# Patient Record
Sex: Female | Born: 1990 | Race: White | Hispanic: No | Marital: Single | Smoking: Current every day smoker
Health system: Southern US, Community
[De-identification: ages and names within clinical notes are randomized; demographics above are authoritative.]

## PROBLEM LIST (undated history)

## (undated) DIAGNOSIS — F1111 Opioid abuse, in remission: Secondary | ICD-10-CM

## (undated) DIAGNOSIS — J45909 Unspecified asthma, uncomplicated: Secondary | ICD-10-CM

---

## 2016-11-13 ENCOUNTER — Emergency Department (HOSPITAL_COMMUNITY): Payer: No Typology Code available for payment source

## 2016-11-13 ENCOUNTER — Inpatient Hospital Stay (HOSPITAL_COMMUNITY): Payer: No Typology Code available for payment source

## 2016-11-13 ENCOUNTER — Encounter (HOSPITAL_COMMUNITY): Payer: Self-pay | Admitting: Emergency Medicine

## 2016-11-13 ENCOUNTER — Inpatient Hospital Stay (HOSPITAL_COMMUNITY): Payer: No Typology Code available for payment source | Admitting: Certified Registered"

## 2016-11-13 ENCOUNTER — Inpatient Hospital Stay (HOSPITAL_COMMUNITY)
Admission: EM | Admit: 2016-11-13 | Discharge: 2016-11-19 | DRG: 503 | Disposition: A | Discharge status: Home / Self Care | Payer: No Typology Code available for payment source | Attending: Surgery | Admitting: Surgery

## 2016-11-13 ENCOUNTER — Encounter (HOSPITAL_COMMUNITY): Admission: EM | Disposition: A | Discharge status: Home / Self Care | Payer: Self-pay | Source: Home / Self Care

## 2016-11-13 DIAGNOSIS — S36039A Unspecified laceration of spleen, initial encounter: Secondary | ICD-10-CM | POA: Diagnosis present

## 2016-11-13 DIAGNOSIS — J45909 Unspecified asthma, uncomplicated: Secondary | ICD-10-CM | POA: Diagnosis present

## 2016-11-13 DIAGNOSIS — F1111 Opioid abuse, in remission: Secondary | ICD-10-CM

## 2016-11-13 DIAGNOSIS — Y92410 Unspecified street and highway as the place of occurrence of the external cause: Secondary | ICD-10-CM | POA: Diagnosis not present

## 2016-11-13 DIAGNOSIS — A599 Trichomoniasis, unspecified: Secondary | ICD-10-CM | POA: Diagnosis present

## 2016-11-13 DIAGNOSIS — Z880 Allergy status to penicillin: Secondary | ICD-10-CM | POA: Diagnosis not present

## 2016-11-13 DIAGNOSIS — B373 Candidiasis of vulva and vagina: Secondary | ICD-10-CM | POA: Diagnosis present

## 2016-11-13 DIAGNOSIS — M25571 Pain in right ankle and joints of right foot: Secondary | ICD-10-CM | POA: Diagnosis present

## 2016-11-13 DIAGNOSIS — S92101B Unspecified fracture of right talus, initial encounter for open fracture: Secondary | ICD-10-CM | POA: Diagnosis present

## 2016-11-13 DIAGNOSIS — S36031A Moderate laceration of spleen, initial encounter: Secondary | ICD-10-CM | POA: Diagnosis present

## 2016-11-13 DIAGNOSIS — Q7291 Unspecified reduction defect of right lower limb: Secondary | ICD-10-CM

## 2016-11-13 DIAGNOSIS — S82891B Other fracture of right lower leg, initial encounter for open fracture type I or II: Secondary | ICD-10-CM

## 2016-11-13 DIAGNOSIS — M25461 Effusion, right knee: Secondary | ICD-10-CM | POA: Diagnosis present

## 2016-11-13 DIAGNOSIS — Z87898 Personal history of other specified conditions: Secondary | ICD-10-CM

## 2016-11-13 DIAGNOSIS — Z09 Encounter for follow-up examination after completed treatment for conditions other than malignant neoplasm: Secondary | ICD-10-CM

## 2016-11-13 DIAGNOSIS — F17210 Nicotine dependence, cigarettes, uncomplicated: Secondary | ICD-10-CM | POA: Diagnosis present

## 2016-11-13 DIAGNOSIS — F1191 Opioid use, unspecified, in remission: Secondary | ICD-10-CM

## 2016-11-13 HISTORY — PX: I & D EXTREMITY: SHX5045

## 2016-11-13 HISTORY — DX: Unspecified asthma, uncomplicated: J45.909

## 2016-11-13 LAB — CBC WITH DIFFERENTIAL/PLATELET
BASOS ABS: 0 10*3/uL (ref 0.0–0.1)
Basophils Relative: 0 %
EOS PCT: 4 %
Eosinophils Absolute: 0.4 10*3/uL (ref 0.0–0.7)
HEMATOCRIT: 37.5 % (ref 36.0–46.0)
Hemoglobin: 12.7 g/dL (ref 12.0–15.0)
LYMPHS PCT: 27 %
Lymphs Abs: 3.1 10*3/uL (ref 0.7–4.0)
MCH: 30.7 pg (ref 26.0–34.0)
MCHC: 33.9 g/dL (ref 30.0–36.0)
MCV: 90.6 fL (ref 78.0–100.0)
MONO ABS: 0.4 10*3/uL (ref 0.1–1.0)
MONOS PCT: 4 %
NEUTROS ABS: 7.3 10*3/uL (ref 1.7–7.7)
Neutrophils Relative %: 65 %
PLATELETS: 289 10*3/uL (ref 150–400)
RBC: 4.14 MIL/uL (ref 3.87–5.11)
RDW: 12.6 % (ref 11.5–15.5)
WBC: 11.2 10*3/uL — ABNORMAL HIGH (ref 4.0–10.5)

## 2016-11-13 LAB — CBC
HCT: 29.7 % — ABNORMAL LOW (ref 36.0–46.0)
HEMATOCRIT: 29.3 % — AB (ref 36.0–46.0)
HEMATOCRIT: 30.4 % — AB (ref 36.0–46.0)
Hemoglobin: 10 g/dL — ABNORMAL LOW (ref 12.0–15.0)
Hemoglobin: 10.1 g/dL — ABNORMAL LOW (ref 12.0–15.0)
Hemoglobin: 10.1 g/dL — ABNORMAL LOW (ref 12.0–15.0)
MCH: 30.6 pg (ref 26.0–34.0)
MCH: 30.8 pg (ref 26.0–34.0)
MCH: 30 pg (ref 26.0–34.0)
MCHC: 34.1 g/dL (ref 30.0–36.0)
MCHC: 34 g/dL (ref 30.0–36.0)
MCHC: 33.2 g/dL (ref 30.0–36.0)
MCV: 89.6 fL (ref 78.0–100.0)
MCV: 90.5 fL (ref 78.0–100.0)
MCV: 90.2 fL (ref 78.0–100.0)
PLATELETS: 194 10*3/uL (ref 150–400)
PLATELETS: 219 10*3/uL (ref 150–400)
Platelets: 206 10*3/uL (ref 150–400)
RBC: 3.27 MIL/uL — ABNORMAL LOW (ref 3.87–5.11)
RBC: 3.28 MIL/uL — AB (ref 3.87–5.11)
RBC: 3.37 MIL/uL — AB (ref 3.87–5.11)
RDW: 12.5 % (ref 11.5–15.5)
RDW: 12.8 % (ref 11.5–15.5)
RDW: 12.6 % (ref 11.5–15.5)
WBC: 8.6 10*3/uL (ref 4.0–10.5)
WBC: 6.8 10*3/uL (ref 4.0–10.5)
WBC: 6.5 10*3/uL (ref 4.0–10.5)

## 2016-11-13 LAB — I-STAT BETA HCG BLOOD, ED (MC, WL, AP ONLY): I-stat hCG, quantitative: <5 m[IU]/mL (ref <5)

## 2016-11-13 LAB — BASIC METABOLIC PANEL
Anion gap: 10 (ref 5–15)
BUN: 14 mg/dL (ref 6–20)
CALCIUM: 9.2 mg/dL (ref 8.9–10.3)
CO2: 26 mmol/L (ref 22–32)
Chloride: 99 mmol/L — ABNORMAL LOW (ref 101–111)
Creatinine, Ser: 0.94 mg/dL (ref 0.44–1.00)
GFR calc Af Amer: >60 mL/min (ref >60)
GLUCOSE: 126 mg/dL — AB (ref 65–99)
Potassium: 3.1 mmol/L — ABNORMAL LOW (ref 3.5–5.1)
Sodium: 135 mmol/L (ref 135–145)

## 2016-11-13 LAB — I-STAT CG4 LACTIC ACID, ED: Lactic Acid, Venous: 2.03 mmol/L (ref 0.5–1.9)

## 2016-11-13 LAB — HIV ANTIBODY (ROUTINE TESTING W REFLEX): HIV SCREEN 4TH GENERATION: NONREACTIVE

## 2016-11-13 LAB — ETHANOL: Alcohol, Ethyl (B): <5 mg/dL (ref <5)

## 2016-11-13 LAB — ABO/RH: ABO/RH(D): O POS

## 2016-11-13 LAB — TYPE AND SCREEN
ABO/RH(D): O POS
Antibody Screen: NEGATIVE

## 2016-11-13 LAB — PROTIME-INR
INR: 0.97
PROTHROMBIN TIME: 12.9 s (ref 11.4–15.2)

## 2016-11-13 LAB — MRSA PCR SCREENING: MRSA by PCR: NEGATIVE

## 2016-11-13 SURGERY — IRRIGATION AND DEBRIDEMENT EXTREMITY
Anesthesia: General | Site: Leg Lower | Laterality: Right

## 2016-11-13 MED ORDER — SUFENTANIL CITRATE 50 MCG/ML IV SOLN
INTRAVENOUS | Status: DC | PRN
  Administered 2016-11-13 (×3): 10 ug via INTRAVENOUS

## 2016-11-13 MED ORDER — CEFAZOLIN SODIUM 1 G IJ SOLR
INTRAMUSCULAR | Status: AC
  Filled 2016-11-13: qty 20

## 2016-11-13 MED ORDER — CHLORHEXIDINE GLUCONATE 4 % EX LIQD
60.0000 mL | Freq: Once | CUTANEOUS | Status: DC
  Filled 2016-11-13: qty 60

## 2016-11-13 MED ORDER — SUFENTANIL CITRATE 50 MCG/ML IV SOLN
INTRAVENOUS | Status: AC
  Filled 2016-11-13: qty 1

## 2016-11-13 MED ORDER — CEFAZOLIN SODIUM-DEXTROSE 2-4 GM/100ML-% IV SOLN
2.0000 g | INTRAVENOUS | Status: AC
  Filled 2016-11-13: qty 100

## 2016-11-13 MED ORDER — CEFAZOLIN IN D5W 1 GM/50ML IV SOLN
1.0000 g | Freq: Four times a day (QID) | INTRAVENOUS | Status: AC
  Administered 2016-11-13 (×2): 1 g via INTRAVENOUS
  Filled 2016-11-13 (×3): qty 50

## 2016-11-13 MED ORDER — DEXAMETHASONE SODIUM PHOSPHATE 10 MG/ML IJ SOLN
INTRAMUSCULAR | Status: DC | PRN
  Administered 2016-11-13: 10 mg via INTRAVENOUS

## 2016-11-13 MED ORDER — SODIUM CHLORIDE 0.9 % IV SOLN
INTRAVENOUS | Status: DC | PRN
  Administered 2016-11-13: 07:00:00 via INTRAVENOUS

## 2016-11-13 MED ORDER — ONDANSETRON HCL 4 MG/2ML IJ SOLN: 4.0000 mg | Freq: Four times a day (QID) | INTRAMUSCULAR | Status: DC | PRN

## 2016-11-13 MED ORDER — LIDOCAINE 2% (20 MG/ML) 5 ML SYRINGE
INTRAMUSCULAR | Status: DC | PRN
  Administered 2016-11-13: 40 mg via INTRAVENOUS
  Administered 2016-11-13: 100 mg via INTRAVENOUS

## 2016-11-13 MED ORDER — CEFAZOLIN SODIUM 1 G IJ SOLR
INTRAMUSCULAR | Status: DC | PRN
  Administered 2016-11-13: 2 g via INTRAMUSCULAR

## 2016-11-13 MED ORDER — SODIUM CHLORIDE 0.9 % IV SOLN
INTRAVENOUS | Status: DC
  Administered 2016-11-13: 06:00:00 via INTRAVENOUS

## 2016-11-13 MED ORDER — IOPAMIDOL (ISOVUE-300) INJECTION 61%
INTRAVENOUS | Status: AC
  Administered 2016-11-13: 100 mL
  Filled 2016-11-13: qty 100

## 2016-11-13 MED ORDER — DEXAMETHASONE SODIUM PHOSPHATE 10 MG/ML IJ SOLN
INTRAMUSCULAR | Status: AC
  Filled 2016-11-13: qty 1

## 2016-11-13 MED ORDER — OXYCODONE HCL 5 MG PO TABS: 5.0000 mg | ORAL_TABLET | Freq: Once | ORAL | Status: DC | PRN

## 2016-11-13 MED ORDER — SUCCINYLCHOLINE CHLORIDE 200 MG/10ML IV SOSY
PREFILLED_SYRINGE | INTRAVENOUS | Status: AC
  Filled 2016-11-13: qty 10

## 2016-11-13 MED ORDER — HYDROMORPHONE HCL 1 MG/ML IJ SOLN
0.5000 mg | INTRAMUSCULAR | Status: DC | PRN
  Administered 2016-11-13 – 2016-11-14 (×3): 0.5 mg via INTRAVENOUS
  Filled 2016-11-13 (×3): qty 1

## 2016-11-13 MED ORDER — SODIUM CHLORIDE 0.9 % IV BOLUS (SEPSIS)
1000.0000 mL | Freq: Once | INTRAVENOUS | Status: AC
  Administered 2016-11-13: 1000 mL via INTRAVENOUS

## 2016-11-13 MED ORDER — LACTATED RINGERS IV SOLN
INTRAVENOUS | Status: DC
  Administered 2016-11-14: 35 mL/h via INTRAVENOUS
  Administered 2016-11-14: 13:00:00 via INTRAVENOUS

## 2016-11-13 MED ORDER — DOCUSATE SODIUM 100 MG PO CAPS
100.0000 mg | ORAL_CAPSULE | Freq: Two times a day (BID) | ORAL | Status: DC
  Administered 2016-11-13 – 2016-11-19 (×10): 100 mg via ORAL
  Filled 2016-11-13 (×11): qty 1

## 2016-11-13 MED ORDER — PROPOFOL 10 MG/ML IV BOLUS
INTRAVENOUS | Status: AC
  Filled 2016-11-13: qty 20

## 2016-11-13 MED ORDER — FENTANYL CITRATE (PF) 100 MCG/2ML IJ SOLN
100.0000 ug | Freq: Once | INTRAMUSCULAR | Status: AC
  Administered 2016-11-13: 100 ug via INTRAVENOUS
  Filled 2016-11-13: qty 2

## 2016-11-13 MED ORDER — ONDANSETRON HCL 4 MG/2ML IJ SOLN
INTRAMUSCULAR | Status: AC
  Filled 2016-11-13: qty 2

## 2016-11-13 MED ORDER — LIDOCAINE 2% (20 MG/ML) 5 ML SYRINGE
INTRAMUSCULAR | Status: AC
  Filled 2016-11-13: qty 5

## 2016-11-13 MED ORDER — PROPOFOL 10 MG/ML IV BOLUS
INTRAVENOUS | Status: DC | PRN
  Administered 2016-11-13 (×2): 50 mg via INTRAVENOUS
  Administered 2016-11-13: 100 mg via INTRAVENOUS
  Administered 2016-11-13: 160 mg via INTRAVENOUS
  Administered 2016-11-13: 150 mg via INTRAVENOUS

## 2016-11-13 MED ORDER — SODIUM CHLORIDE 0.9 % IJ SOLN
INTRAMUSCULAR | Status: AC
  Filled 2016-11-13: qty 10

## 2016-11-13 MED ORDER — OXYCODONE HCL 5 MG PO TABS: 5.0000 mg | ORAL_TABLET | ORAL | Status: DC | PRN

## 2016-11-13 MED ORDER — ACETAMINOPHEN 325 MG PO TABS: 650.0000 mg | ORAL_TABLET | ORAL | Status: DC | PRN

## 2016-11-13 MED ORDER — OXYCODONE HCL 5 MG PO TABS
5.0000 mg | ORAL_TABLET | ORAL | Status: DC | PRN
  Administered 2016-11-13 – 2016-11-14 (×4): 10 mg via ORAL
  Filled 2016-11-13 (×5): qty 2

## 2016-11-13 MED ORDER — BISACODYL 10 MG RE SUPP
10.0000 mg | Freq: Every day | RECTAL | Status: DC | PRN
  Administered 2016-11-19: 10 mg via RECTAL
  Filled 2016-11-13: qty 1

## 2016-11-13 MED ORDER — HYDROMORPHONE HCL 1 MG/ML IJ SOLN: 0.5000 mg | INTRAMUSCULAR | Status: DC | PRN

## 2016-11-13 MED ORDER — SUCCINYLCHOLINE CHLORIDE 200 MG/10ML IV SOSY
PREFILLED_SYRINGE | INTRAVENOUS | Status: DC | PRN
  Administered 2016-11-13: 100 mg via INTRAVENOUS

## 2016-11-13 MED ORDER — LACTATED RINGERS IV SOLN
INTRAVENOUS | Status: DC | PRN
  Administered 2016-11-13: 08:00:00 via INTRAVENOUS

## 2016-11-13 MED ORDER — HYDROMORPHONE HCL 1 MG/ML IJ SOLN: 0.2500 mg | INTRAMUSCULAR | Status: DC | PRN

## 2016-11-13 MED ORDER — HYDROMORPHONE HCL 2 MG/ML IJ SOLN
1.0000 mg | Freq: Once | INTRAMUSCULAR | Status: AC
  Administered 2016-11-13: 1 mg via INTRAVENOUS
  Filled 2016-11-13: qty 1

## 2016-11-13 MED ORDER — ONDANSETRON HCL 4 MG/2ML IJ SOLN
4.0000 mg | Freq: Once | INTRAMUSCULAR | Status: AC
  Administered 2016-11-13: 4 mg via INTRAVENOUS

## 2016-11-13 MED ORDER — ONDANSETRON HCL 4 MG/2ML IJ SOLN
INTRAMUSCULAR | Status: DC | PRN
  Administered 2016-11-13: 4 mg via INTRAVENOUS

## 2016-11-13 MED ORDER — SODIUM CHLORIDE 0.9 % IR SOLN
Status: DC | PRN
  Administered 2016-11-13: 1000 mL

## 2016-11-13 MED ORDER — PROPOFOL 10 MG/ML IV BOLUS
INTRAVENOUS | Status: AC
  Filled 2016-11-13: qty 40

## 2016-11-13 MED ORDER — PROPOFOL 10 MG/ML IV BOLUS
40.0000 mg | Freq: Once | INTRAVENOUS | Status: AC
  Administered 2016-11-13: 40 mg via INTRAVENOUS

## 2016-11-13 MED ORDER — IOPAMIDOL (ISOVUE-370) INJECTION 76%
INTRAVENOUS | Status: AC
  Filled 2016-11-13: qty 50

## 2016-11-13 MED ORDER — PROPOFOL 10 MG/ML IV BOLUS
40.0000 mg | Freq: Once | INTRAVENOUS | Status: AC
  Administered 2016-11-13: 40 mg via INTRAVENOUS
  Filled 2016-11-13: qty 20

## 2016-11-13 MED ORDER — ORAL CARE MOUTH RINSE
15.0000 mL | Freq: Two times a day (BID) | OROMUCOSAL | Status: DC
  Administered 2016-11-13: 15 mL via OROMUCOSAL

## 2016-11-13 MED ORDER — CEFAZOLIN IN D5W 1 GM/50ML IV SOLN
1.0000 g | Freq: Once | INTRAVENOUS | Status: AC
  Administered 2016-11-13: 1 g via INTRAVENOUS
  Filled 2016-11-13: qty 50

## 2016-11-13 MED ORDER — HYDROMORPHONE HCL 2 MG/ML IJ SOLN
0.5000 mg | INTRAMUSCULAR | Status: DC | PRN
  Administered 2016-11-13: 0.5 mg via INTRAVENOUS
  Filled 2016-11-13: qty 1

## 2016-11-13 MED ORDER — ONDANSETRON HCL 4 MG PO TABS: 4.0000 mg | ORAL_TABLET | Freq: Four times a day (QID) | ORAL | Status: DC | PRN

## 2016-11-13 MED ORDER — OXYCODONE HCL 5 MG/5ML PO SOLN: 5.0000 mg | Freq: Once | ORAL | Status: DC | PRN

## 2016-11-13 MED ORDER — SODIUM CHLORIDE 0.9 % IR SOLN
Status: DC | PRN
  Administered 2016-11-13: 3000 mL

## 2016-11-13 SURGICAL SUPPLY — 55 items
BANDAGE ACE 4X5 VEL STRL LF (GAUZE/BANDAGES/DRESSINGS) ×3 IMPLANT
BANDAGE ACE 6X5 VEL STRL LF (GAUZE/BANDAGES/DRESSINGS) ×3 IMPLANT
BANDAGE ELASTIC 6 VELCRO ST LF (GAUZE/BANDAGES/DRESSINGS) ×3 IMPLANT
BANDAGE ESMARK 6X9 LF (GAUZE/BANDAGES/DRESSINGS) IMPLANT
BLADE SURG 10 STRL SS (BLADE) ×3 IMPLANT
BNDG COHESIVE 4X5 TAN STRL (GAUZE/BANDAGES/DRESSINGS) ×3 IMPLANT
BNDG ESMARK 4X9 LF (GAUZE/BANDAGES/DRESSINGS) IMPLANT
BNDG ESMARK 6X9 LF (GAUZE/BANDAGES/DRESSINGS)
BNDG GAUZE ELAST 4 BULKY (GAUZE/BANDAGES/DRESSINGS) ×3 IMPLANT
CONT SPEC 4OZ CLIKSEAL STRL BL (MISCELLANEOUS) IMPLANT
COVER SURGICAL LIGHT HANDLE (MISCELLANEOUS) ×3 IMPLANT
CUFF TOURN SGL LL 12 NO SLV (MISCELLANEOUS) IMPLANT
CUFF TOURNIQUET SINGLE 34IN LL (TOURNIQUET CUFF) ×3 IMPLANT
DRAPE SURG 17X23 STRL (DRAPES) IMPLANT
DRAPE U-SHAPE 47X51 STRL (DRAPES) IMPLANT
DRSG PAD ABDOMINAL 8X10 ST (GAUZE/BANDAGES/DRESSINGS) ×3 IMPLANT
DURAPREP 26ML APPLICATOR (WOUND CARE) ×3 IMPLANT
ELECT REM PT RETURN 9FT ADLT (ELECTROSURGICAL)
ELECTRODE REM PT RTRN 9FT ADLT (ELECTROSURGICAL) IMPLANT
EVACUATOR 1/8 PVC DRAIN (DRAIN) IMPLANT
FACESHIELD WRAPAROUND (MASK) IMPLANT
GAUZE SPONGE 4X4 12PLY STRL (GAUZE/BANDAGES/DRESSINGS) ×3 IMPLANT
GAUZE XEROFORM 1X8 LF (GAUZE/BANDAGES/DRESSINGS) ×3 IMPLANT
GLOVE BIO SURGEON STRL SZ7.5 (GLOVE) ×6 IMPLANT
GLOVE BIOGEL PI IND STRL 8 (GLOVE) ×2 IMPLANT
GLOVE BIOGEL PI INDICATOR 8 (GLOVE) ×4
GOWN STRL REUS W/ TWL LRG LVL3 (GOWN DISPOSABLE) ×3 IMPLANT
GOWN STRL REUS W/TWL LRG LVL3 (GOWN DISPOSABLE) ×6
HANDPIECE INTERPULSE COAX TIP (DISPOSABLE)
KIT BASIN OR (CUSTOM PROCEDURE TRAY) ×3 IMPLANT
KIT ROOM TURNOVER OR (KITS) ×3 IMPLANT
MANIFOLD NEPTUNE II (INSTRUMENTS) ×3 IMPLANT
NEEDLE 25GAX1.5 (MISCELLANEOUS) IMPLANT
NS IRRIG 1000ML POUR BTL (IV SOLUTION) ×3 IMPLANT
PACK ORTHO EXTREMITY (CUSTOM PROCEDURE TRAY) ×3 IMPLANT
PAD ABD 8X10 STRL (GAUZE/BANDAGES/DRESSINGS) ×6 IMPLANT
PAD ARMBOARD 7.5X6 YLW CONV (MISCELLANEOUS) ×6 IMPLANT
PADDING CAST ABS 4INX4YD NS (CAST SUPPLIES) ×4
PADDING CAST ABS 6INX4YD NS (CAST SUPPLIES) ×2
PADDING CAST ABS COTTON 4X4 ST (CAST SUPPLIES) ×2 IMPLANT
PADDING CAST ABS COTTON 6X4 NS (CAST SUPPLIES) ×1 IMPLANT
SET HNDPC FAN SPRY TIP SCT (DISPOSABLE) IMPLANT
SPONGE LAP 18X18 X RAY DECT (DISPOSABLE) IMPLANT
STOCKINETTE IMPERVIOUS 9X36 MD (GAUZE/BANDAGES/DRESSINGS) ×3 IMPLANT
SUT ETHILON 3 0 PS 1 (SUTURE) ×6 IMPLANT
SUT PDS AB 2-0 CT1 27 (SUTURE) IMPLANT
SYR CONTROL 10ML LL (SYRINGE) IMPLANT
TOWEL OR 17X24 6PK STRL BLUE (TOWEL DISPOSABLE) ×3 IMPLANT
TOWEL OR 17X26 10 PK STRL BLUE (TOWEL DISPOSABLE) ×3 IMPLANT
TUBE ANAEROBIC SPECIMEN COL (MISCELLANEOUS) IMPLANT
TUBE CONNECTING 12'X1/4 (SUCTIONS) ×1
TUBE CONNECTING 12X1/4 (SUCTIONS) ×2 IMPLANT
TUBING CYSTO DISP (UROLOGICAL SUPPLIES) ×3 IMPLANT
UNDERPAD 30X30 (UNDERPADS AND DIAPERS) ×3 IMPLANT
YANKAUER SUCT BULB TIP NO VENT (SUCTIONS) ×3 IMPLANT

## 2016-11-13 NOTE — Progress Notes (Signed)
Orthopedic Tech Progress Note Patient Details:  Caitlyn Ferguson 05/05/1991 [id number]  Ortho Devices Type of Ortho Device: Post (short leg) splint, Stirrup splint Ortho Device/Splint Location: rle. applied as per drs verbal order Ortho Device/Splint Interventions: Ordered, Application   Trinna PostMartinez, Talani Brazee J 11/13/2016, 2:42 AM

## 2016-11-13 NOTE — Op Note (Signed)
11/13/2016  8:31 AM  PATIENT:  Caitlyn Ferguson    PRE-OPERATIVE DIAGNOSIS:  open ankle fracture  POST-OPERATIVE DIAGNOSIS:  Same  PROCEDURE:  IRRIGATION AND DEBRIDEMENT FOOT  SURGEON:  Deriona Altemose, Jewel BaizeIMOTHY D, MD  ASSISTANT: Aquilla HackerHenry Martensen, PA-C, he was present and scrubbed throughout the case, critical for completion in a timely fashion, and for retraction, instrumentation, and closure.   ANESTHESIA:   gen  PREOPERATIVE INDICATIONS:  Caitlyn Ferguson is a  26 y.o. female with a diagnosis of open ankle fracture who failed conservative measures and elected for surgical management.    The risks benefits and alternatives were discussed with the patient preoperatively including but not limited to the risks of infection, bleeding, nerve injury, cardiopulmonary complications, the need for revision surgery, among others, and the patient was willing to proceed.  OPERATIVE IMPLANTS: none  OPERATIVE FINDINGS: unstable subtalar joint  BLOOD LOSS: min  COMPLICATIONS: none  TOURNIQUET TIME: none  OPERATIVE PROCEDURE:  Patient was identified in the preoperative holding area and site was marked by me She was transported to the operating theater and placed on the table in supine position taking care to pad all bony prominences. After a preincinduction time out anesthesia was induced. The right lower extremity was prepped and draped in normal sterile fashion and a pre-incision timeout was performed. She received ancef for preoperative antibiotics.   I performed a thorough irrigation of her open fracture with copious irrigation.  I performed a debridement of her open fracture dislocation site including skin muscle and bone, I used pickups for this. Her subtalar joint was unstable to inversion but was able to easily be held reduced with positioning.  She had smooth plantar and dorsi flextion with no bony block.  I performed a complex closure of her 8cm laceration    POST OPERATIVE PLAN: NWB, splint  then SLC. Mobilize and ASA for dvt px.

## 2016-11-13 NOTE — Progress Notes (Signed)
PT Cancellation Note  Patient Details Name: Caitlyn Ferguson MRN: [id number] DOB: October 16, 1990   Cancelled Treatment:    Reason Eval/Treat Not Completed: Patient not medically ready. Per surgeon's note on 11/13/16, bedrest for today. PT will f/u with pt when appropriate.   Alessandra BevelsJennifer M Kagan Hietpas 11/13/2016, 3:48 PM

## 2016-11-13 NOTE — Progress Notes (Signed)
To or already, will need defnitive care for ankle later Follow hb, bedrest today

## 2016-11-13 NOTE — ED Notes (Signed)
Patient taken to OR.

## 2016-11-13 NOTE — ED Notes (Signed)
Patient continues in CT, parents returned to Trauma B from waiting room.  Parents updated.

## 2016-11-13 NOTE — Progress Notes (Signed)
Dr Eulah PontMurphy at bedside to readjust splint- awaiting on Thayer Ohmhris, CRNA to bedside

## 2016-11-13 NOTE — ED Notes (Signed)
Patient continues in CT at this time.

## 2016-11-13 NOTE — Consult Note (Signed)
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 Talus Os Trigonum Calcaneus Cuboid  Plan: Closed reduction emergently in the ED performed by me.  OR today for Formal I&D and complex wound closure  PROCEDURE: sedation provided by ED staff. After appropriate timeout I performed a closed reduction of her subtalar joint.   Renette Butters, MD Cell [phone]   11/13/2016 7:48 AM

## 2016-11-13 NOTE — ED Notes (Signed)
Patient to CT.

## 2016-11-13 NOTE — ED Notes (Signed)
Per Dr Fredricka Bonineonnor, cspine clear.

## 2016-11-13 NOTE — Anesthesia Procedure Notes (Signed)
Procedure Name: Intubation Date/Time: 11/13/2016 7:35 AM Performed by: Claris Che Pre-anesthesia Checklist: Patient identified, Emergency Drugs available, Suction available, Patient being monitored and Timeout performed Patient Re-evaluated:Patient Re-evaluated prior to inductionOxygen Delivery Method: Circle system utilized Preoxygenation: Pre-oxygenation with 100% oxygen Intubation Type: IV induction, Rapid sequence and Cricoid Pressure applied Ventilation: Mask ventilation without difficulty Laryngoscope Size: Mac and 3 Grade View: Grade II Tube type: Oral Tube size: 8.0 mm Number of attempts: 1 Airway Equipment and Method: Stylet Placement Confirmation: ETT inserted through vocal cords under direct vision,  positive ETCO2 and breath sounds checked- equal and bilateral Secured at: 24 cm Tube secured with: Tape Dental Injury: Teeth and Oropharynx as per pre-operative assessment

## 2016-11-13 NOTE — Progress Notes (Signed)
[id number]- Splint redone by Dr Eulah PontMurphy at bedside in PACU- Dr Chaney MallingHodierne & Thayer Ohmhris, CRNA at bedside for procedure- Propofol given by Dr Chaney MallingHodierne. Pt tolerated procedure well. Ankle Xray reshot after splint replacedper orders.

## 2016-11-13 NOTE — ED Notes (Signed)
Trauma surgeon paged
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
## 2016-11-13 NOTE — Progress Notes (Signed)
Orthopedic Tech Progress Note Patient Details:  Mila Homershley Osuna 1991-07-10 [id number]  Ortho Devices Type of Ortho Device: Post (short leg) splint, Stirrup splint Ortho Device/Splint Location: rle Ortho Device/Splint Interventions: Ordered, Application Assisted dr with reduction and new splint reapplication.  Trinna PostMartinez, Clare Fennimore J 11/13/2016, 6:16 AM
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
## 2016-11-13 NOTE — ED Notes (Signed)
Dr. Murphy at bedside.

## 2016-11-13 NOTE — Transfer of Care (Signed)
Immediate Anesthesia Transfer of Care Note  Patient: Caitlyn Ferguson  Procedure(s) Performed: Procedure(s): IRRIGATION AND DEBRIDEMENT FOOT (Right)  Patient Location: PACU  Anesthesia Type:General  Level of Consciousness: sedated, patient cooperative and responds to stimulation  Airway & Oxygen Therapy: Patient Spontanous Breathing and Patient connected to nasal cannula oxygen  Post-op Assessment: Report given to RN, Post -op Vital signs reviewed and stable and Patient moving all extremities X 4  Post vital signs: Reviewed and stable  Last Vitals:  Vitals:   11/13/16 0630 11/13/16 0645  BP: 114/72 112/67  Pulse: 99 105  Resp: 15 19  Temp:      Last Pain:  Vitals:   11/13/16 0630  TempSrc:   PainSc: 8          Complications: No apparent anesthesia complications

## 2016-11-13 NOTE — Addendum Note (Signed)
Addendum  created 11/13/16 1104 by Rosalio Macadamiahristine T Kani Jobson, CRNA   Anesthesia Attestations filed, Anesthesia Intra Flowsheets edited, Anesthesia Intra Meds edited
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
## 2016-11-13 NOTE — Anesthesia Preprocedure Evaluation (Signed)
Anesthesia Evaluation  Patient identified by MRN, date of birth, ID band Patient awake    Reviewed: Allergy & Precautions, H&P , NPO status , Patient's Chart, lab work & pertinent test results  Airway Mallampati: II   Neck ROM: full    Dental   Pulmonary asthma , Current Smoker,    breath sounds clear to auscultation       Cardiovascular negative cardio ROS   Rhythm:regular Rate:Normal     Neuro/Psych    GI/Hepatic   Endo/Other    Renal/GU      Musculoskeletal   Abdominal   Peds  Hematology   Anesthesia Other Findings   Reproductive/Obstetrics                             Anesthesia Physical Anesthesia Plan  ASA: II  Anesthesia Plan: General   Post-op Pain Management:    Induction: Intravenous  Airway Management Planned: LMA  Additional Equipment:   Intra-op Plan:   Post-operative Plan:   Informed Consent: I have reviewed the patients History and Physical, chart, labs and discussed the procedure including the risks, benefits and alternatives for the proposed anesthesia with the patient or authorized representative who has indicated his/her understanding and acceptance.     Plan Discussed with: CRNA, Anesthesiologist and Surgeon  Anesthesia Plan Comments:         Anesthesia Quick Evaluation

## 2016-11-13 NOTE — ED Notes (Signed)
Patient complaining of left flank pain at this time.  Patient to be medicated per orders.

## 2016-11-13 NOTE — ED Notes (Signed)
Family at the bedside.
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
## 2016-11-13 NOTE — ED Notes (Signed)
Awaiting CT

## 2016-11-13 NOTE — ED Notes (Signed)
t 94.4 placed on the bair hugger  Rectal temp.  bp responding to iv nss  Pt resting from the dilaudid given

## 2016-11-13 NOTE — Anesthesia Postprocedure Evaluation (Signed)
Anesthesia Post Note  Patient: Caitlyn Ferguson  Procedure(s) Performed: Procedure(s) (LRB): IRRIGATION AND DEBRIDEMENT FOOT (Right)  Patient location during evaluation: PACU Anesthesia Type: General Level of consciousness: awake and alert and patient cooperative Pain management: pain level controlled Vital Signs Assessment: post-procedure vital signs reviewed and stable Respiratory status: spontaneous breathing and respiratory function stable Cardiovascular status: stable Anesthetic complications: no       Last Vitals:  Vitals:   11/13/16 0856 11/13/16 0912  BP: 106/70 120/76  Pulse: 78 78  Resp: (!) 29 17  Temp:      Last Pain:  Vitals:   11/13/16 0840  TempSrc:   PainSc: Asleep                 Joanthony Hamza S

## 2016-11-14 ENCOUNTER — Inpatient Hospital Stay (HOSPITAL_COMMUNITY): Payer: No Typology Code available for payment source | Admitting: Anesthesiology

## 2016-11-14 ENCOUNTER — Inpatient Hospital Stay (HOSPITAL_COMMUNITY): Payer: No Typology Code available for payment source

## 2016-11-14 ENCOUNTER — Encounter (HOSPITAL_COMMUNITY): Payer: Self-pay | Admitting: Orthopedic Surgery

## 2016-11-14 ENCOUNTER — Encounter (HOSPITAL_COMMUNITY): Admission: EM | Disposition: A | Discharge status: Home / Self Care | Payer: Self-pay | Source: Home / Self Care

## 2016-11-14 HISTORY — PX: ORIF CALCANEOUS FRACTURE: SHX5030

## 2016-11-14 LAB — COMPREHENSIVE METABOLIC PANEL
ALK PHOS: 52 U/L (ref 38–126)
ALT: 28 U/L (ref 14–54)
AST: 27 U/L (ref 15–41)
Albumin: 2.9 g/dL — ABNORMAL LOW (ref 3.5–5.0)
Anion gap: 6 (ref 5–15)
BILIRUBIN TOTAL: 0.3 mg/dL (ref 0.3–1.2)
BUN: 6 mg/dL (ref 6–20)
CALCIUM: 8.5 mg/dL — AB (ref 8.9–10.3)
CHLORIDE: 107 mmol/L (ref 101–111)
CO2: 23 mmol/L (ref 22–32)
CREATININE: 0.72 mg/dL (ref 0.44–1.00)
Glucose, Bld: 111 mg/dL — ABNORMAL HIGH (ref 65–99)
Potassium: 3.7 mmol/L (ref 3.5–5.1)
Sodium: 136 mmol/L (ref 135–145)
TOTAL PROTEIN: 5.4 g/dL — AB (ref 6.5–8.1)

## 2016-11-14 LAB — CBC
HEMATOCRIT: 29.8 % — AB (ref 36.0–46.0)
Hemoglobin: 9.8 g/dL — ABNORMAL LOW (ref 12.0–15.0)
MCH: 29.6 pg (ref 26.0–34.0)
MCHC: 32.9 g/dL (ref 30.0–36.0)
MCV: 90 fL (ref 78.0–100.0)
PLATELETS: 134 10*3/uL — AB (ref 150–400)
RBC: 3.31 MIL/uL — ABNORMAL LOW (ref 3.87–5.11)
RDW: 13 % (ref 11.5–15.5)
WBC: 7.2 10*3/uL (ref 4.0–10.5)

## 2016-11-14 SURGERY — OPEN REDUCTION INTERNAL FIXATION (ORIF) CALCANEOUS FRACTURE
Anesthesia: General | Site: Leg Lower | Laterality: Right

## 2016-11-14 MED ORDER — ROCURONIUM BROMIDE 50 MG/5ML IV SOSY
PREFILLED_SYRINGE | INTRAVENOUS | Status: AC
  Filled 2016-11-14: qty 5

## 2016-11-14 MED ORDER — FENTANYL CITRATE (PF) 100 MCG/2ML IJ SOLN
INTRAMUSCULAR | Status: AC
  Filled 2016-11-14: qty 2

## 2016-11-14 MED ORDER — ASPIRIN EC 325 MG PO TBEC: 325.0000 mg | DELAYED_RELEASE_TABLET | Freq: Every day | ORAL | 0 refills | Status: DC

## 2016-11-14 MED ORDER — ROCURONIUM BROMIDE 10 MG/ML (PF) SYRINGE
PREFILLED_SYRINGE | INTRAVENOUS | Status: DC | PRN
  Administered 2016-11-14: 10 mg via INTRAVENOUS
  Administered 2016-11-14: 50 mg via INTRAVENOUS

## 2016-11-14 MED ORDER — METHOCARBAMOL 500 MG PO TABS
500.0000 mg | ORAL_TABLET | Freq: Four times a day (QID) | ORAL | Status: DC | PRN
  Administered 2016-11-14 – 2016-11-18 (×9): 500 mg via ORAL
  Filled 2016-11-14 (×9): qty 1

## 2016-11-14 MED ORDER — METHOCARBAMOL 1000 MG/10ML IJ SOLN
500.0000 mg | Freq: Four times a day (QID) | INTRAVENOUS | Status: DC | PRN
  Filled 2016-11-14: qty 5

## 2016-11-14 MED ORDER — SUFENTANIL CITRATE 50 MCG/ML IV SOLN
INTRAVENOUS | Status: AC
  Filled 2016-11-14: qty 1

## 2016-11-14 MED ORDER — LIDOCAINE 2% (20 MG/ML) 5 ML SYRINGE
INTRAMUSCULAR | Status: DC | PRN
  Administered 2016-11-14: 40 mg via INTRAVENOUS
  Administered 2016-11-14: 60 mg via INTRAVENOUS

## 2016-11-14 MED ORDER — FENTANYL CITRATE (PF) 100 MCG/2ML IJ SOLN
INTRAMUSCULAR | Status: DC | PRN
  Administered 2016-11-14 (×3): 50 ug via INTRAVENOUS
  Administered 2016-11-14: 100 ug via INTRAVENOUS
  Administered 2016-11-14: 50 ug via INTRAVENOUS

## 2016-11-14 MED ORDER — MIDAZOLAM HCL 5 MG/5ML IJ SOLN
INTRAMUSCULAR | Status: DC | PRN
  Administered 2016-11-14: 2 mg via INTRAVENOUS

## 2016-11-14 MED ORDER — CEFAZOLIN SODIUM-DEXTROSE 2-4 GM/100ML-% IV SOLN
INTRAVENOUS | Status: AC
  Filled 2016-11-14: qty 100

## 2016-11-14 MED ORDER — PROPOFOL 10 MG/ML IV BOLUS
INTRAVENOUS | Status: AC
  Filled 2016-11-14: qty 20

## 2016-11-14 MED ORDER — ONDANSETRON HCL 4 MG PO TABS: 4.0000 mg | ORAL_TABLET | Freq: Three times a day (TID) | ORAL | 0 refills | Status: DC | PRN

## 2016-11-14 MED ORDER — DOCUSATE SODIUM 100 MG PO CAPS: 100.0000 mg | ORAL_CAPSULE | Freq: Two times a day (BID) | ORAL | 0 refills | Status: DC

## 2016-11-14 MED ORDER — ESMOLOL HCL 100 MG/10ML IV SOLN
INTRAVENOUS | Status: DC | PRN
  Administered 2016-11-14 (×2): 20 mg via INTRAVENOUS

## 2016-11-14 MED ORDER — HYDROMORPHONE HCL 1 MG/ML IJ SOLN
INTRAMUSCULAR | Status: AC
  Administered 2016-11-14: 0.5 mg via INTRAVENOUS
  Filled 2016-11-14: qty 0.5

## 2016-11-14 MED ORDER — HYDROMORPHONE HCL 1 MG/ML IJ SOLN
0.5000 mg | INTRAMUSCULAR | Status: DC | PRN
  Administered 2016-11-14 – 2016-11-15 (×3): 0.5 mg via INTRAVENOUS
  Filled 2016-11-14 (×3): qty 1

## 2016-11-14 MED ORDER — HYDROMORPHONE HCL 1 MG/ML IJ SOLN
INTRAMUSCULAR | Status: AC
  Filled 2016-11-14: qty 0.5

## 2016-11-14 MED ORDER — BUPIVACAINE HCL (PF) 0.25 % IJ SOLN
INTRAMUSCULAR | Status: AC
  Filled 2016-11-14: qty 60

## 2016-11-14 MED ORDER — METHOCARBAMOL 500 MG PO TABS: 500.0000 mg | ORAL_TABLET | Freq: Four times a day (QID) | ORAL | 0 refills | Status: DC | PRN

## 2016-11-14 MED ORDER — OXYCODONE HCL 5 MG PO TABS: 5.0000 mg | ORAL_TABLET | Freq: Once | ORAL | Status: DC | PRN

## 2016-11-14 MED ORDER — 0.9 % SODIUM CHLORIDE (POUR BTL) OPTIME
TOPICAL | Status: DC | PRN
  Administered 2016-11-14: 1000 mL

## 2016-11-14 MED ORDER — OXYCODONE-ACETAMINOPHEN 5-325 MG PO TABS: 1.0000 | ORAL_TABLET | ORAL | 0 refills | Status: DC | PRN

## 2016-11-14 MED ORDER — SUGAMMADEX SODIUM 200 MG/2ML IV SOLN
INTRAVENOUS | Status: DC | PRN
  Administered 2016-11-14: 141.8 mg via INTRAVENOUS

## 2016-11-14 MED ORDER — CEFAZOLIN SODIUM-DEXTROSE 2-3 GM-% IV SOLR
INTRAVENOUS | Status: DC | PRN
  Administered 2016-11-14: 2 g via INTRAVENOUS

## 2016-11-14 MED ORDER — PROPOFOL 10 MG/ML IV BOLUS
INTRAVENOUS | Status: DC | PRN
  Administered 2016-11-14: 150 mg via INTRAVENOUS
  Administered 2016-11-14: 30 mg via INTRAVENOUS

## 2016-11-14 MED ORDER — ONDANSETRON HCL 4 MG/2ML IJ SOLN
INTRAMUSCULAR | Status: DC | PRN
  Administered 2016-11-14: 4 mg via INTRAVENOUS

## 2016-11-14 MED ORDER — HYDROMORPHONE HCL 1 MG/ML IJ SOLN
0.2500 mg | INTRAMUSCULAR | Status: DC | PRN
  Administered 2016-11-14 (×3): 0.5 mg via INTRAVENOUS

## 2016-11-14 MED ORDER — ALBUTEROL SULFATE (2.5 MG/3ML) 0.083% IN NEBU
2.5000 mg | INHALATION_SOLUTION | Freq: Four times a day (QID) | RESPIRATORY_TRACT | Status: DC | PRN
  Administered 2016-11-14: 2.5 mg via RESPIRATORY_TRACT

## 2016-11-14 MED ORDER — MIDAZOLAM HCL 2 MG/2ML IJ SOLN
INTRAMUSCULAR | Status: AC
  Filled 2016-11-14: qty 2

## 2016-11-14 MED ORDER — OXYCODONE HCL 5 MG/5ML PO SOLN: 5.0000 mg | Freq: Once | ORAL | Status: DC | PRN

## 2016-11-14 MED ORDER — ALBUTEROL SULFATE (2.5 MG/3ML) 0.083% IN NEBU
INHALATION_SOLUTION | RESPIRATORY_TRACT | Status: AC
  Administered 2016-11-14: 2.5 mg via RESPIRATORY_TRACT
  Filled 2016-11-14: qty 3

## 2016-11-14 SURGICAL SUPPLY — 57 items
BANDAGE ACE 4X5 VEL STRL LF (GAUZE/BANDAGES/DRESSINGS) ×3 IMPLANT
BANDAGE ACE 6X5 VEL STRL LF (GAUZE/BANDAGES/DRESSINGS) ×3 IMPLANT
BANDAGE ESMARK 6X9 LF (GAUZE/BANDAGES/DRESSINGS) ×1 IMPLANT
BIT DRILL CANN 3.2 (BIT) ×1 IMPLANT
BNDG COHESIVE 4X5 TAN STRL (GAUZE/BANDAGES/DRESSINGS) ×3 IMPLANT
BNDG ESMARK 6X9 LF (GAUZE/BANDAGES/DRESSINGS) ×3
CLOSURE WOUND 1/2 X4 (GAUZE/BANDAGES/DRESSINGS) ×1
COVER SURGICAL LIGHT HANDLE (MISCELLANEOUS) ×3 IMPLANT
CUFF TOURNIQUET SINGLE 34IN LL (TOURNIQUET CUFF) ×3 IMPLANT
DRAPE C-ARM 42X72 X-RAY (DRAPES) IMPLANT
DRAPE ORTHO SPLIT 77X108 STRL (DRAPES)
DRAPE SURG ORHT 6 SPLT 77X108 (DRAPES) IMPLANT
DRAPE U-SHAPE 47X51 STRL (DRAPES) IMPLANT
DRILL BIT CANN 3.2 (BIT) ×3
DRSG ADAPTIC 3X8 NADH LF (GAUZE/BANDAGES/DRESSINGS) ×3 IMPLANT
DRSG PAD ABDOMINAL 8X10 ST (GAUZE/BANDAGES/DRESSINGS) ×3 IMPLANT
DURAPREP 26ML APPLICATOR (WOUND CARE) ×3 IMPLANT
ELECT REM PT RETURN 9FT ADLT (ELECTROSURGICAL) ×3
ELECTRODE REM PT RTRN 9FT ADLT (ELECTROSURGICAL) ×1 IMPLANT
GAUZE SPONGE 4X4 12PLY STRL (GAUZE/BANDAGES/DRESSINGS) ×3 IMPLANT
GAUZE XEROFORM 1X8 LF (GAUZE/BANDAGES/DRESSINGS) ×3 IMPLANT
GLOVE BIO SURGEON STRL SZ7.5 (GLOVE) ×6 IMPLANT
GLOVE BIOGEL PI IND STRL 8 (GLOVE) ×2 IMPLANT
GLOVE BIOGEL PI INDICATOR 8 (GLOVE) ×4
GOWN STRL REUS W/ TWL LRG LVL3 (GOWN DISPOSABLE) ×3 IMPLANT
GOWN STRL REUS W/TWL LRG LVL3 (GOWN DISPOSABLE) ×6
GUIDEWIRE NON THREAD 1.6MM (WIRE) ×6 IMPLANT
KIT BASIN OR (CUSTOM PROCEDURE TRAY) ×3 IMPLANT
KIT ROOM TURNOVER OR (KITS) ×3 IMPLANT
MANIFOLD NEPTUNE II (INSTRUMENTS) ×3 IMPLANT
NS IRRIG 1000ML POUR BTL (IV SOLUTION) ×3 IMPLANT
PACK ORTHO EXTREMITY (CUSTOM PROCEDURE TRAY) ×3 IMPLANT
PAD ABD 8X10 STRL (GAUZE/BANDAGES/DRESSINGS) ×3 IMPLANT
PAD ARMBOARD 7.5X6 YLW CONV (MISCELLANEOUS) ×6 IMPLANT
PAD CAST 4YDX4 CTTN HI CHSV (CAST SUPPLIES) ×2 IMPLANT
PADDING CAST ABS 4INX4YD NS (CAST SUPPLIES) ×2
PADDING CAST ABS 6INX4YD NS (CAST SUPPLIES) ×2
PADDING CAST ABS COTTON 4X4 ST (CAST SUPPLIES) ×1 IMPLANT
PADDING CAST ABS COTTON 6X4 NS (CAST SUPPLIES) ×1 IMPLANT
PADDING CAST COTTON 4X4 STRL (CAST SUPPLIES) ×4
SCREW HEADLESS COMP 4.5X20MM (Screw) ×3 IMPLANT
SCREW HEADLESS COMP 4.5X34MM (Screw) ×3 IMPLANT
SPONGE LAP 18X18 X RAY DECT (DISPOSABLE) ×3 IMPLANT
STRIP CLOSURE SKIN 1/2X4 (GAUZE/BANDAGES/DRESSINGS) ×2 IMPLANT
SUCTION FRAZIER HANDLE 10FR (MISCELLANEOUS) ×2
SUCTION TUBE FRAZIER 10FR DISP (MISCELLANEOUS) ×1 IMPLANT
SUT MNCRL AB 4-0 PS2 18 (SUTURE) ×3 IMPLANT
SUT MON AB 2-0 CT1 27 (SUTURE) ×6 IMPLANT
SYR BULB IRRIGATION 50ML (SYRINGE) ×3 IMPLANT
SYR CONTROL 10ML LL (SYRINGE) IMPLANT
TOWEL OR 17X24 6PK STRL BLUE (TOWEL DISPOSABLE) ×3 IMPLANT
TOWEL OR 17X26 10 PK STRL BLUE (TOWEL DISPOSABLE) ×3 IMPLANT
TUBE CONNECTING 12'X1/4 (SUCTIONS) ×1
TUBE CONNECTING 12X1/4 (SUCTIONS) ×2 IMPLANT
UNDERPAD 30X30 (UNDERPADS AND DIAPERS) ×3 IMPLANT
WATER STERILE IRR 1000ML POUR (IV SOLUTION) ×3 IMPLANT
YANKAUER SUCT BULB TIP NO VENT (SUCTIONS) IMPLANT

## 2016-11-14 NOTE — Progress Notes (Signed)
Notified Dr.Conner about elevated heart rate 120-140 likely related to pain. Pain med adjusted for better pain control I will continue to monitor.

## 2016-11-14 NOTE — Anesthesia Preprocedure Evaluation (Signed)
Anesthesia Evaluation  Patient identified by MRN, date of birth, ID band Patient awake    Reviewed: Allergy & Precautions, H&P , NPO status , Patient's Chart, lab work & pertinent test results  History of Anesthesia Complications Negative for: history of anesthetic complications  Airway Mallampati: II  TM Distance: >3 FB Neck ROM: full    Dental  (+) Teeth Intact   Pulmonary asthma , Current Smoker,    breath sounds clear to auscultation       Cardiovascular negative cardio ROS   Rhythm:regular Rate:Normal     Neuro/Psych negative neurological ROS  negative psych ROS   GI/Hepatic negative GI ROS, Neg liver ROS,   Endo/Other  negative endocrine ROS  Renal/GU negative Renal ROS     Musculoskeletal   Abdominal   Peds  Hematology  (+) anemia ,   Anesthesia Other Findings   Reproductive/Obstetrics                             Anesthesia Physical Anesthesia Plan  ASA: II  Anesthesia Plan: General and Regional   Post-op Pain Management:    Induction: Intravenous  Airway Management Planned: Oral ETT  Additional Equipment: None  Intra-op Plan:   Post-operative Plan: Extubation in OR  Informed Consent: I have reviewed the patients History and Physical, chart, labs and discussed the procedure including the risks, benefits and alternatives for the proposed anesthesia with the patient or authorized representative who has indicated his/her understanding and acceptance.   Dental advisory given  Plan Discussed with: CRNA and Surgeon  Anesthesia Plan Comments:         Anesthesia Quick Evaluation

## 2016-11-14 NOTE — Op Note (Signed)
11/13/2016 - 11/14/2016  2:48 PM  PATIENT:  Caitlyn Ferguson    PRE-OPERATIVE DIAGNOSIS:  open right ankle fx  POST-OPERATIVE DIAGNOSIS:  Same  PROCEDURE:  OPEN REDUCTION INTERNAL FIXATION (ORIF) TALUS  SURGEON:  Layli Capshaw, Jewel BaizeIMOTHY D, MD  ASSISTANT: Aquilla HackerHenry Martensen, PA-C, he was present and scrubbed throughout the case, critical for completion in a timely fashion, and for retraction, instrumentation, and closure.   ANESTHESIA:   gen  PREOPERATIVE INDICATIONS:  Caitlyn Homershley Wollman is a  26 y.o. female with a diagnosis of open right ankle fx who failed conservative measures and elected for surgical management.    The risks benefits and alternatives were discussed with the patient preoperatively including but not limited to the risks of infection, bleeding, nerve injury, cardiopulmonary complications, the need for revision surgery, among others, and the patient was willing to proceed.  OPERATIVE IMPLANTS: headleass synthes screws  OPERATIVE FINDINGS: unstable subtalar joint  BLOOD LOSS: min  COMPLICATIONS: none  TOURNIQUET TIME: 90  OPERATIVE PROCEDURE:  Patient was identified in the preoperative holding area and site was marked by me She was transported to the operating theater and placed on the table in supine position taking care to pad all bony prominences. After a preincinduction time out anesthesia was induced. The right lower extremity was prepped and draped in normal sterile fashion and a pre-incision timeout was performed. She received ancef for preoperative antibiotics.   Her subtalar joint was still grossly unstable.  I made a posterior medial approach to her ankle making a longitudinal incision just medial to the Achilles tendon I dissected down bluntly and identified her FHL tendon I retracted this medially thus protecting her neurovascular structures and her posterior tibial tendon. I then identified her fracture site I visualized her subtalar and tibiotalar joint. She had  significant comminution. I was able to reduce the 2 large pieces and pinned them into place. I then placed headless compression screws to hold these in place I visualized her joint it was smooth and mobilized well.  I then used fluoroscopy to place a smooth K wire across her talonavicular joint to hold this in place as well. I then took multiple x-rays and was happy with our reduction of the fracture and pinning as well as the open reduction and fixation of her talus. 

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
## 2016-11-14 NOTE — H&P (View-Only) (Signed)
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 of: Talus Os Trigonum Calcaneus Cuboid  Plan: Closed reduction emergently in the ED performed by me.  OR today for Formal I&D and complex wound closure  PROCEDURE: sedation provided by ED staff. After appropriate timeout I performed a closed reduction of her subtalar joint.   Renette Butters, MD Cell [phone]   11/13/2016 7:48 AM

## 2016-11-14 NOTE — Transfer of Care (Signed)
Immediate Anesthesia Transfer of Care Note  Patient: Caitlyn Ferguson  Procedure(s) Performed: Procedure(s): OPEN REDUCTION INTERNAL FIXATION (ORIF) TALUS (Right)  Patient Location: PACU  Anesthesia Type:General  Level of Consciousness: awake, alert , oriented, patient cooperative and responds to stimulation  Airway & Oxygen Therapy: Patient Spontanous Breathing and Patient connected to nasal cannula oxygen  Post-op Assessment: Report given to RN, Post -op Vital signs reviewed and stable and Patient moving all extremities X 4  Post vital signs: Reviewed and stable  Last Vitals:  Vitals:   11/13/16 2357 11/14/16 0355  BP: 113/69 114/66  Pulse: 88 96  Resp: 16 12  Temp: 36.8 C 37.3 C    Last Pain:  Vitals:   11/14/16 0728  TempSrc:   PainSc: Asleep      Patients Stated Pain Goal: 0 (11/13/16 1630)  Complications: No apparent anesthesia complications

## 2016-11-14 NOTE — Anesthesia Procedure Notes (Signed)
Procedure Name: Intubation Date/Time: 11/14/2016 10:45 AM Performed by: Tressia Miners LEFFEW Pre-anesthesia Checklist: Patient identified, Patient being monitored, Timeout performed, Emergency Drugs available and Suction available Patient Re-evaluated:Patient Re-evaluated prior to inductionOxygen Delivery Method: Circle System Utilized Preoxygenation: Pre-oxygenation with 100% oxygen Intubation Type: IV induction Ventilation: Mask ventilation without difficulty Laryngoscope Size: Mac and 3 Grade View: Grade II Tube type: Oral Tube size: 7.5 mm Number of attempts: 1 Airway Equipment and Method: Stylet Placement Confirmation: ETT inserted through vocal cords under direct vision,  positive ETCO2 and breath sounds checked- equal and bilateral Secured at: 22 cm Tube secured with: Tape Dental Injury: Teeth and Oropharynx as per pre-operative assessment
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
## 2016-11-14 NOTE — Anesthesia Postprocedure Evaluation (Addendum)
Anesthesia Post Note  Patient: Caitlyn Ferguson  Procedure(s) Performed: Procedure(s) (LRB): OPEN REDUCTION INTERNAL FIXATION (ORIF) TALUS (Right)  Patient location during evaluation: PACU Anesthesia Type: General Level of consciousness: awake and alert and patient cooperative Pain management: pain level controlled Vital Signs Assessment: post-procedure vital signs reviewed and stable Respiratory status: spontaneous breathing and respiratory function stable Cardiovascular status: stable Anesthetic complications: no       Last Vitals:  Vitals:   11/14/16 1453 11/14/16 1604  BP: (!) 135/95 (!) 142/94  Pulse: 84 (!) 117  Resp: 12 19  Temp:  37 C    Last Pain:  Vitals:   11/14/16 1604  TempSrc: Oral  PainSc:                  [name]

## 2016-11-14 NOTE — Progress Notes (Signed)
PT Cancellation Note  Patient Details Name: Mila Homershley Frye MRN: [id number] DOB: 1990/12/05   Cancelled Treatment:    Reason Eval/Treat Not Completed: Patient at procedure or test/unavailable;Patient not medically ready. Pt in OR undergoing R ankle ORIF. PT to follow up tomorrow for eval.   Ilda FoilGarrow, Gerhart Ruggieri Rene 11/14/2016, 9:08 AM

## 2016-11-14 NOTE — Interval H&P Note (Signed)
History and Physical Interval Note:  11/14/2016 7:39 AM  Caitlyn Ferguson  has presented today for surgery, with the diagnosis of open right ankle fx  The various methods of treatment have been discussed with the patient and family. After consideration of risks, benefits and other options for treatment, the patient has consented to  Procedure(s): OPEN REDUCTION INTERNAL FIXATION (ORIF) TALUS (Right) as a surgical intervention .  The patient's history has been reviewed, patient examined, no change in status, stable for surgery.  I have reviewed the patient's chart and labs.  Questions were answered to the patient's satisfaction.     Alexio Sroka D

## 2016-11-15 DIAGNOSIS — F1191 Opioid use, unspecified, in remission: Secondary | ICD-10-CM

## 2016-11-15 DIAGNOSIS — S82891B Other fracture of right lower leg, initial encounter for open fracture type I or II: Secondary | ICD-10-CM

## 2016-11-15 DIAGNOSIS — Z87898 Personal history of other specified conditions: Secondary | ICD-10-CM

## 2016-11-15 DIAGNOSIS — F1111 Opioid abuse, in remission: Secondary | ICD-10-CM

## 2016-11-15 LAB — CBC WITH DIFFERENTIAL/PLATELET
BASOS ABS: 0 10*3/uL (ref 0.0–0.1)
BASOS PCT: 0 %
EOS ABS: 0.1 10*3/uL (ref 0.0–0.7)
Eosinophils Relative: 1 %
HEMATOCRIT: 34.2 % — AB (ref 36.0–46.0)
Hemoglobin: 11.5 g/dL — ABNORMAL LOW (ref 12.0–15.0)
Lymphocytes Relative: 33 %
Lymphs Abs: 2.4 10*3/uL (ref 0.7–4.0)
MCH: 30.6 pg (ref 26.0–34.0)
MCHC: 33.6 g/dL (ref 30.0–36.0)
MCV: 91 fL (ref 78.0–100.0)
Monocytes Absolute: 0.6 10*3/uL (ref 0.1–1.0)
Monocytes Relative: 8 %
NEUTROS ABS: 4.2 10*3/uL (ref 1.7–7.7)
NEUTROS PCT: 58 %
Platelets: 183 10*3/uL (ref 150–400)
RBC: 3.76 MIL/uL — ABNORMAL LOW (ref 3.87–5.11)
RDW: 12.5 % (ref 11.5–15.5)
WBC: 7.2 10*3/uL (ref 4.0–10.5)

## 2016-11-15 LAB — CBC
HEMATOCRIT: 24.4 % — AB (ref 36.0–46.0)
Hemoglobin: 8.4 g/dL — ABNORMAL LOW (ref 12.0–15.0)
MCH: 31.2 pg (ref 26.0–34.0)
MCHC: 34.4 g/dL (ref 30.0–36.0)
MCV: 90.7 fL (ref 78.0–100.0)
Platelets: 135 10*3/uL — ABNORMAL LOW (ref 150–400)
RBC: 2.69 MIL/uL — ABNORMAL LOW (ref 3.87–5.11)
RDW: 12.9 % (ref 11.5–15.5)
WBC: 5.7 10*3/uL (ref 4.0–10.5)

## 2016-11-15 MED ORDER — OXYCODONE HCL 5 MG PO TABS
5.0000 mg | ORAL_TABLET | ORAL | Status: DC | PRN
  Administered 2016-11-15 (×2): 15 mg via ORAL
  Administered 2016-11-16: 10 mg via ORAL
  Filled 2016-11-15: qty 3
  Filled 2016-11-15: qty 2
  Filled 2016-11-15: qty 3

## 2016-11-15 MED ORDER — HYDROMORPHONE HCL 1 MG/ML IJ SOLN
1.0000 mg | INTRAMUSCULAR | Status: DC | PRN
  Administered 2016-11-15 – 2016-11-16 (×9): 1 mg via INTRAVENOUS
  Filled 2016-11-15 (×9): qty 1

## 2016-11-15 NOTE — Progress Notes (Signed)
Patient has been complaining of 10/10 pain in her right foot/ankle all morning. Pain medications have not been effective. Patient still able to wiggle toes, but states she feels like it's less than she was able to do yesterday. Dr. Eulah PontMurphy with ortho notified who stated to remove ACE wrap and he would be up to evaluate patient. Additional pain medication administered. Patient informed. RLE elevated on multiple pillows and ice packs applied.  Leanna BattlesEckelmann, Keyante Durio Eileen, RN.
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
## 2016-11-15 NOTE — Progress Notes (Addendum)
Addendum: Splint web roll split anteriorly the entire length to relieve pressure with increased swelling.  Patient reports continuous pain, but decreased slightly after splitting.  Maintained gauze dressing over percutaneous pinning site and replaced ace wrap more loosely.  HCM, PA-C 11/15/16 1310 [phone]  Assessment: 1 Day Post-Op  S/P Procedure(s) (LRB): OPEN REDUCTION INTERNAL FIXATION (ORIF) TALUS (Right), talonavicular pinning. by Dr. Jewel Baize. Murphy on 11/14/16  Principal Problem:   Splenic laceration Active Problems:   History of heroin abuse   History of methadone use (HCC)   Open right ankle fracture  Poor pain control with a history of heroin abuse.  5 years on methadone up to Spring 2017. Significant pain and swelling / effusion Right knee.  No lesion / erythema.  XR Neg. For Fx.    Plan: Increased pain medication some.  Dilaudid 1 mg q 2 hr, Oxycodone IR 5-15 q 3 hr.  Continue tylenol- May need additional adjustment dt hx of heroin / methadone use.  Elevate leg at all times to reduce pain / swelling. Mobilize when able / cleared for splenic laceration. May need immobilizer for Right knee - will likely need MRI at some point.  Exam very limited dt pain.  May consider therapeutic aspiration.   Weight Bearing: Non Weight Bearing (NWB) RLE. Dressings: Splint RLE.  VTE prophylaxis: Per primary when cleared for splenic laceration.  SCD, ambulation.   Would normally place on Lovenox while inpatient, then Lovenox or ASA 325 mg x 30 days post op.  Dispo: Per primary.  Will Be NWB 6+ weeks RLE.  Follow up outpatient with Dr. Wandra Feinstein at about 2 weeks post op.  We will plan to leave talonavicular pin in for about 4 weeks.  Subjective: Patient reports pain as severe. Pain poorly controlled with IV and PO meds.  Tolerating liquids.    Objective:   VITALS:   Vitals:   11/14/16 2014 11/14/16 2319 11/15/16 0353 11/15/16 0400  BP: (!) 132/95 130/83  (!) 137/95  Pulse:  (!) 130 (!) 125 (!) 110   Resp: 20 18 19    Temp: 98.5 F (36.9 C) 99 F (37.2 C) 99.8 F (37.7 C)   TempSrc: Oral Oral Oral   SpO2: 99% 99% 96%   Weight:      Height:       CBC Latest Ref Rng & Units 11/15/2016 11/14/2016 11/13/2016  WBC 4.0 - 10.5 K/uL 5.7 7.2 6.5  Hemoglobin 12.0 - 15.0 g/dL 0.9(W) 1.1(B) 10.1(L)  Hematocrit 36.0 - 46.0 % 24.4(L) 29.8(L) 30.4(L)  Platelets 150 - 400 K/uL 135(L) 134(L) 219   BMP Latest Ref Rng & Units 11/14/2016 11/13/2016  Glucose 65 - 99 mg/dL 147(W) 295(A)  BUN 6 - 20 mg/dL 6 14  Creatinine 2.13 - 1.00 mg/dL 0.86 5.78  Sodium 469 - 145 mmol/L 136 135  Potassium 3.5 - 5.1 mmol/L 3.7 3.1(L)  Chloride 101 - 111 mmol/L 107 99(L)  CO2 22 - 32 mmol/L 23 26  Calcium 8.9 - 10.3 mg/dL 6.2(X) 9.2   Intake/Output      03/04 0701 - 03/05 0700 03/05 0701 - 03/06 0700   I.V. (mL/kg) 1350 (19)    IV Piggyback     Total Intake(mL/kg) 1350 (19)    Urine (mL/kg/hr) 400 (0.2)    Blood 25 (0)    Total Output 425     Net +925            EXAM: PORTABLE RIGHT ANKLE - 2 VIEW  COMPARISON:  Intraoperative spot fluoroscopic images earlier today. Right ankle radiographs 11/13/2016.  FINDINGS: As seen on earlier intraoperative images, there has been interval talar fracture repair with placement of 2 screws in the posterior talus. The posterior talus fracture has been reduced and is now in anatomic alignment. The talonavicular dislocation has been reduced, and a percutaneous pin has been placed across the joint terminating in the anterior talar process. Small osseous fragments are again seen near the anterior process of the calcaneus, and there is a persistent mildly displaced fracture of the cuboid. Cast material is in place.  IMPRESSION: Postoperative changes from talar fracture repair and talonavicular reduction with additional previously described fractures as above.   Electronically Signed   By: Sebastian AcheAllen  Grady M.D.   On: 11/14/2016  15:42  Physical Exam: General: Very uncomfortable.  Lying in bed.  No increased wob.  Conversant.  MSK RLE: Splint in place.  Leg elevated.  Significant swelling / effusion Right knee. Pain with palpation / ROM at knee. Toes warm. EHL / FHL intact Sensation intact distally   Albina BilletHenry Calvin Martensen III, PA-C 11/15/2016, 7:32 AM
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
## 2016-11-15 NOTE — Progress Notes (Signed)
Trauma Service Note  Subjective: Patient in chair at the side of her bed.  No distress, but having a lot of pain on her left side and in her right leg  Objective: Vital signs in last 24 hours: Temp:  [97.5 F (36.4 C)-99.8 F (37.7 C)] 98.2 F (36.8 C) (03/05 0748) Pulse Rate:  [79-135] 121 (03/05 0748) Resp:  [12-24] 19 (03/05 0748) BP: (116-142)/(62-119) 136/92 (03/05 0748) SpO2:  [88 %-100 %] 96 % (03/05 0748) Last BM Date: 11/13/16  Intake/Output from previous day: 03/04 0701 - 03/05 0700 In: 1350 [I.V.:1350] Out: 425 [Urine:400; Blood:25] Intake/Output this shift: No intake/output data recorded.  General: Mild acute distress  Lungs: Clear bilaterally  Abd: Soft, tender in the LUQ.  Good bowel sounds.  Eating but not much  Extremities: Right foot very swollen, tender to passive dorsiflexion.  Compartment soft at the top of splint.  Neuro: Intact  Lab Results: CBC   Recent Labs  11/14/16 1545 11/15/16 0235  WBC 7.2 5.7  HGB 9.8* 8.4*  HCT 29.8* 24.4*  PLT 134* 135*   BMET  Recent Labs  11/13/16 0138 11/14/16 0240  NA 135 136  K 3.1* 3.7  CL 99* 107  CO2 26 23  GLUCOSE 126* 111*  BUN 14 6  CREATININE 0.94 0.72  CALCIUM 9.2 8.5*   PT/INR  Recent Labs  11/13/16 0138  LABPROT 12.9  INR 0.97   ABG No results for input(s): PHART, HCO3 in the last 72 hours.  Invalid input(s): PCO2, PO2  Studies/Results: Dg Ankle 2 Views Right  Result Date: 11/14/2016 CLINICAL DATA:  Repair of talar dome of fracture FLUOROSCOPY TIME:  106 seconds. Images: 2 EXAM: RIGHT ANKLE - 2 VIEW COMPARISON:  None. FINDINGS: Two screws project over the posterior talar dome by the end of the study consistent with fracture repair. A pin or surgical instrument project over the anterior inferior aspect of the talar dome. IMPRESSION: Talar dome of fracture repair as above. Electronically Signed   By: Gerome Samavid  Williams III M.D   On: 11/14/2016 15:18   X-ray Ankle Right Ap Lateral  And Oblique  Result Date: 11/13/2016 CLINICAL DATA:  Status post reduction. EXAM: RIGHT ANKLE - COMPLETE 3+ VIEW COMPARISON:  Radiograph of same day. FINDINGS: The right ankle has been casted and immobilized. There remains moderately displaced fracture involving the posterior portion of the talus. Continued dislocation of the talus the navicular joint is noted. IMPRESSION: Fracture dislocation as described above. Status post casting and immobilization. Electronically Signed   By: Lupita RaiderJames  Green Jr, M.D.   On: 11/13/2016 11:59   Dg Ankle Right Port  Result Date: 11/14/2016 CLINICAL DATA:  Status post talar fracture repair. EXAM: PORTABLE RIGHT ANKLE - 2 VIEW COMPARISON:  Intraoperative spot fluoroscopic images earlier today. Right ankle radiographs 11/13/2016. FINDINGS: As seen on earlier intraoperative images, there has been interval talar fracture repair with placement of 2 screws in the posterior talus. The posterior talus fracture has been reduced and is now in anatomic alignment. The talonavicular dislocation has been reduced, and a percutaneous pin has been placed across the joint terminating in the anterior talar process. Small osseous fragments are again seen near the anterior process of the calcaneus, and there is a persistent mildly displaced fracture of the cuboid. Cast material is in place. IMPRESSION: Postoperative changes from talar fracture repair and talonavicular reduction with additional previously described fractures as above. Electronically Signed   By: Sebastian AcheAllen  Grady M.D.   On: 11/14/2016 15:42  Dg C-arm 61-120 Min-no Report  Result Date: 11/14/2016 Fluoroscopy was utilized by the requesting physician.  No radiographic interpretation.    Anti-infectives: Anti-infectives    Start     Dose/Rate Route Frequency Ordered Stop   11/14/16 1000  ceFAZolin (ANCEF) 2-4 GM/100ML-% IVPB  Status:  Discontinued    Comments:  Marrianne Mood   : cabinet override      11/14/16 1000 11/14/16 1022    11/14/16 0600  ceFAZolin (ANCEF) IVPB 2g/100 mL premix     2 g 200 mL/hr over 30 Minutes Intravenous To ShortStay Surgical 11/13/16 1255 11/14/16 0624   11/13/16 0915  ceFAZolin (ANCEF) IVPB 1 g/50 mL premix     1 g 100 mL/hr over 30 Minutes Intravenous Every 6 hours 11/13/16 0907 11/14/16 0314   11/13/16 0300  ceFAZolin (ANCEF) IVPB 1 g/50 mL premix     1 g 100 mL/hr over 30 Minutes Intravenous  Once 11/13/16 0245 11/13/16 0345      Assessment/Plan: s/p Procedure(s): OPEN REDUCTION INTERNAL FIXATION (ORIF) TALUS Patient should be on bedrest because of Grade III splenic laceration at least until Wednesday morning.  Will place her back on bedrest, but keep diet advanced.  LOS: 2 days   Marta Lamas. Gae Bon, MD, FACS [phone] Trauma Surgeon 11/15/2016

## 2016-11-16 LAB — CBC
HCT: 27.8 % — ABNORMAL LOW (ref 36.0–46.0)
Hemoglobin: 9.3 g/dL — ABNORMAL LOW (ref 12.0–15.0)
MCH: 29.9 pg (ref 26.0–34.0)
MCHC: 33.5 g/dL (ref 30.0–36.0)
MCV: 89.4 fL (ref 78.0–100.0)
PLATELETS: 170 10*3/uL (ref 150–400)
RBC: 3.11 MIL/uL — ABNORMAL LOW (ref 3.87–5.11)
RDW: 12.4 % (ref 11.5–15.5)
WBC: 5.8 10*3/uL (ref 4.0–10.5)

## 2016-11-16 LAB — CBC WITH DIFFERENTIAL/PLATELET
Basophils Absolute: 0 10*3/uL (ref 0.0–0.1)
Basophils Relative: 0 %
EOS ABS: 0.1 10*3/uL (ref 0.0–0.7)
EOS PCT: 2 %
HCT: 26.7 % — ABNORMAL LOW (ref 36.0–46.0)
HEMOGLOBIN: 9.2 g/dL — AB (ref 12.0–15.0)
LYMPHS ABS: 2.4 10*3/uL (ref 0.7–4.0)
LYMPHS PCT: 42 %
MCH: 31 pg (ref 26.0–34.0)
MCHC: 34.5 g/dL (ref 30.0–36.0)
MCV: 89.9 fL (ref 78.0–100.0)
MONOS PCT: 9 %
Monocytes Absolute: 0.5 10*3/uL (ref 0.1–1.0)
Neutro Abs: 2.7 10*3/uL (ref 1.7–7.7)
Neutrophils Relative %: 47 %
PLATELETS: 148 10*3/uL — AB (ref 150–400)
RBC: 2.97 MIL/uL — ABNORMAL LOW (ref 3.87–5.11)
RDW: 12.7 % (ref 11.5–15.5)
WBC: 5.7 10*3/uL (ref 4.0–10.5)

## 2016-11-16 MED ORDER — POLYETHYLENE GLYCOL 3350 17 G PO PACK
17.0000 g | PACK | Freq: Every day | ORAL | Status: DC
  Administered 2016-11-16 – 2016-11-19 (×4): 17 g via ORAL
  Filled 2016-11-16 (×4): qty 1

## 2016-11-16 MED ORDER — TRAMADOL HCL 50 MG PO TABS
50.0000 mg | ORAL_TABLET | Freq: Four times a day (QID) | ORAL | Status: DC
  Administered 2016-11-16 – 2016-11-19 (×11): 50 mg via ORAL
  Filled 2016-11-16 (×13): qty 1

## 2016-11-16 MED ORDER — HYDROMORPHONE HCL 2 MG/ML IJ SOLN
1.0000 mg | INTRAMUSCULAR | Status: DC | PRN
  Administered 2016-11-16 – 2016-11-17 (×3): 1 mg via INTRAVENOUS
  Filled 2016-11-16 (×3): qty 1

## 2016-11-16 MED ORDER — OXYCODONE HCL 5 MG PO TABS
10.0000 mg | ORAL_TABLET | ORAL | Status: DC | PRN
  Administered 2016-11-16 – 2016-11-19 (×14): 20 mg via ORAL
  Filled 2016-11-16 (×16): qty 4
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
## 2016-11-16 NOTE — Progress Notes (Signed)
PT Cancellation Note  Patient Details Name: Caitlyn Ferguson MRN: [id number] DOB: Mar 07, 1991   Cancelled Treatment:    Reason Eval/Treat Not Completed: Patient not medically ready. Pt has been placed on bedrest, will follow and continue with PT services once appropriate.    Christiane HaBenjamin J. [name], PT, CSCS Pager [phone] Office [phone]  11/16/2016, 10:13 AM

## 2016-11-16 NOTE — Progress Notes (Signed)
Patient transferred to room 6N08 from 4E via bed.  Alert and oriented x4.  C/o pain 8/10 right leg. Oriented to room and call bell.

## 2016-11-16 NOTE — Progress Notes (Signed)
OT Cancellation Note  Patient Details Name: Mila Homershley Penagos MRN: [id number] DOB: 06-14-1991   Cancelled Treatment:    Reason Eval/Treat Not Completed: Patient not medically ready (strict bedrest order set) OT order received and appreciated however this conflicts with current bedrest order set. Please increase activity tolerance as appropriate and remove bedrest from orders. .  OT will hold evaluation at this time and will check back as time allows pending increased activity orders.   Boone MasterJones, Xavius Spadafore B 11/16/2016, 7:21 AM
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
## 2016-11-16 NOTE — Progress Notes (Signed)
Report given, Patient to be transported via bed to 6N room 8

## 2016-11-17 LAB — CBC
HCT: 28.1 % — ABNORMAL LOW (ref 36.0–46.0)
Hemoglobin: 9.5 g/dL — ABNORMAL LOW (ref 12.0–15.0)
MCH: 30.2 pg (ref 26.0–34.0)
MCHC: 33.8 g/dL (ref 30.0–36.0)
MCV: 89.2 fL (ref 78.0–100.0)
PLATELETS: 173 10*3/uL (ref 150–400)
RBC: 3.15 MIL/uL — AB (ref 3.87–5.11)
RDW: 12.3 % (ref 11.5–15.5)
WBC: 4.6 10*3/uL (ref 4.0–10.5)

## 2016-11-17 MED ORDER — FLUCONAZOLE 100 MG PO TABS
200.0000 mg | ORAL_TABLET | Freq: Once | ORAL | Status: AC
  Administered 2016-11-17: 200 mg via ORAL
  Filled 2016-11-17: qty 2

## 2016-11-17 NOTE — Progress Notes (Signed)
OT Cancellation Note  Patient Details Name: Mila Homershley Blasing MRN: [id number] DOB: 1990-12-15   Cancelled Treatment:    Reason Eval/Treat Not Completed: Patient not medically ready (bedrest order set remains) OT order received and appreciated however this conflicts with current bedrest order set. Please increase activity tolerance as appropriate and remove bedrest from orders.  OT will hold evaluation at this time and will check back as time allows pending increased activity orders.   Harolyn RutherfordJones, Gerhardt Gleed B  [phone] 11/17/2016, 7:16 AM

## 2016-11-17 NOTE — Progress Notes (Signed)
PT Cancellation Note  Patient Details Name: Caitlyn Ferguson MRN: [id number] DOB: [birth year]   Cancelled Treatment:    Reason Eval/Treat Not Completed: Medical issues which prohibited therapy (Pt still on bedrest.  Await incr activity orders. )Please update when pt is appropriate for mobility.     Amadeo GarnetDawn F Lux Skilton 11/17/2016, 8:23 AM Eber Jonesawn Cana Mignano,PT Acute Rehabilitation [phone] (pager)
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
## 2016-11-18 ENCOUNTER — Encounter (HOSPITAL_COMMUNITY): Payer: Self-pay | Admitting: Orthopedic Surgery

## 2016-11-18 LAB — URINALYSIS, ROUTINE W REFLEX MICROSCOPIC
Bilirubin Urine: NEGATIVE
Glucose, UA: NEGATIVE mg/dL
Ketones, ur: NEGATIVE mg/dL
Nitrite: NEGATIVE
PROTEIN: NEGATIVE mg/dL
Specific Gravity, Urine: 1.008 (ref 1.005–1.030)
pH: 6 (ref 5.0–8.0)

## 2016-11-18 LAB — CBC
HEMATOCRIT: 26.9 % — AB (ref 36.0–46.0)
HEMOGLOBIN: 9.2 g/dL — AB (ref 12.0–15.0)
MCH: 30.3 pg (ref 26.0–34.0)
MCHC: 34.2 g/dL (ref 30.0–36.0)
MCV: 88.5 fL (ref 78.0–100.0)
Platelets: 209 10*3/uL (ref 150–400)
RBC: 3.04 MIL/uL — ABNORMAL LOW (ref 3.87–5.11)
RDW: 12.3 % (ref 11.5–15.5)
WBC: 4.3 10*3/uL (ref 4.0–10.5)

## 2016-11-18 LAB — BASIC METABOLIC PANEL
ANION GAP: 6 (ref 5–15)
BUN: 6 mg/dL (ref 6–20)
CALCIUM: 8.6 mg/dL — AB (ref 8.9–10.3)
CHLORIDE: 99 mmol/L — AB (ref 101–111)
CO2: 28 mmol/L (ref 22–32)
Creatinine, Ser: 0.65 mg/dL (ref 0.44–1.00)
GFR calc non Af Amer: >60 mL/min (ref >60)
Glucose, Bld: 95 mg/dL (ref 65–99)
Potassium: 4.1 mmol/L (ref 3.5–5.1)
SODIUM: 133 mmol/L — AB (ref 135–145)

## 2016-11-18 MED ORDER — MAGNESIUM CITRATE PO SOLN
1.0000 | Freq: Once | ORAL | Status: AC
  Administered 2016-11-18: 1 via ORAL
  Filled 2016-11-18: qty 296
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
## 2016-11-19 DIAGNOSIS — A599 Trichomoniasis, unspecified: Secondary | ICD-10-CM

## 2016-11-19 LAB — CBC
HCT: 27.8 % — ABNORMAL LOW (ref 36.0–46.0)
HEMOGLOBIN: 9.3 g/dL — AB (ref 12.0–15.0)
MCH: 29.6 pg (ref 26.0–34.0)
MCHC: 33.5 g/dL (ref 30.0–36.0)
MCV: 88.5 fL (ref 78.0–100.0)
PLATELETS: 270 10*3/uL (ref 150–400)
RBC: 3.14 MIL/uL — AB (ref 3.87–5.11)
RDW: 12.3 % (ref 11.5–15.5)
WBC: 4.8 10*3/uL (ref 4.0–10.5)

## 2016-11-19 LAB — URINE CULTURE

## 2016-11-19 MED ORDER — CEFTRIAXONE SODIUM 500 MG IJ SOLR: 250.0000 mg | INTRAMUSCULAR | Status: DC

## 2016-11-19 MED ORDER — AZITHROMYCIN 250 MG PO TABS: 1000.0000 mg | ORAL_TABLET | Freq: Once | ORAL | Status: DC

## 2016-11-19 MED ORDER — OXYCODONE HCL 10 MG PO TABS: 10.0000 mg | ORAL_TABLET | ORAL | 0 refills | Status: DC | PRN

## 2016-11-19 MED ORDER — AZITHROMYCIN 1 G PO PACK: 1.0000 g | PACK | Freq: Once | ORAL | Status: DC

## 2016-11-19 MED ORDER — GENTAMICIN NICU IM SYRINGE 40 MG/ML: 240.0000 mg | Freq: Once | INTRAMUSCULAR | Status: DC

## 2016-11-19 MED ORDER — AZITHROMYCIN 250 MG PO TABS
2000.0000 mg | ORAL_TABLET | Freq: Once | ORAL | Status: AC
  Administered 2016-11-19: 2000 mg via ORAL
  Filled 2016-11-19: qty 8

## 2016-11-19 MED ORDER — DIPHENHYDRAMINE HCL 50 MG/ML IJ SOLN: 25.0000 mg | Freq: Four times a day (QID) | INTRAMUSCULAR | Status: DC | PRN

## 2016-11-19 MED ORDER — METRONIDAZOLE 500 MG PO TABS
2000.0000 mg | ORAL_TABLET | Freq: Once | ORAL | Status: AC
  Administered 2016-11-19: 2000 mg via ORAL
  Filled 2016-11-19: qty 4

## 2016-11-19 MED ORDER — GENTAMICIN SULFATE 40 MG/ML IJ SOLN
240.0000 mg | Freq: Once | INTRAMUSCULAR | Status: AC
  Administered 2016-11-19: 240 mg via INTRAMUSCULAR
  Filled 2016-11-19: qty 6
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
## 2016-11-19 NOTE — Progress Notes (Signed)
PT Cancellation Note  Patient Details Name: Caitlyn Ferguson DOB: [birth year]   Cancelled Treatment:    Reason Eval/Treat Not Completed: Patient declined, no reason specified Pt declined mobility despite encouragement to practice stairs. Pt getting ready to d/c home. PT will continue to follow acutely.    Caitlyn Ferguson, Caitlyn Ferguson   11/19/2016, 1:37 PM
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
## 2016-11-19 NOTE — Progress Notes (Signed)
Right lower leg splint dry and intact, maint on NWB. Pt is ambulating with walker. Pain is controlled with oral narcotics. Discharge instructions given to pt, verbalized understanding. Discharged to home.
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
## 2017-01-21 ENCOUNTER — Encounter (HOSPITAL_COMMUNITY): Payer: Self-pay | Admitting: Emergency Medicine

## 2017-01-21 ENCOUNTER — Emergency Department (HOSPITAL_COMMUNITY)
Admission: EM | Admit: 2017-01-21 | Discharge: 2017-01-21 | Disposition: A | Discharge status: Home / Self Care | Payer: Self-pay | Attending: Emergency Medicine | Admitting: Emergency Medicine

## 2017-01-21 ENCOUNTER — Emergency Department (HOSPITAL_COMMUNITY): Payer: Self-pay

## 2017-01-21 DIAGNOSIS — F17200 Nicotine dependence, unspecified, uncomplicated: Secondary | ICD-10-CM | POA: Insufficient documentation

## 2017-01-21 DIAGNOSIS — Z7982 Long term (current) use of aspirin: Secondary | ICD-10-CM | POA: Insufficient documentation

## 2017-01-21 DIAGNOSIS — M79671 Pain in right foot: Secondary | ICD-10-CM | POA: Insufficient documentation

## 2017-01-21 DIAGNOSIS — J45909 Unspecified asthma, uncomplicated: Secondary | ICD-10-CM | POA: Insufficient documentation

## 2017-01-21 DIAGNOSIS — G8918 Other acute postprocedural pain: Secondary | ICD-10-CM | POA: Insufficient documentation

## 2017-01-21 DIAGNOSIS — Z79899 Other long term (current) drug therapy: Secondary | ICD-10-CM | POA: Insufficient documentation

## 2017-01-21 LAB — BASIC METABOLIC PANEL
ANION GAP: 10 (ref 5–15)
BUN: 8 mg/dL (ref 6–20)
CO2: 23 mmol/L (ref 22–32)
Calcium: 8.9 mg/dL (ref 8.9–10.3)
Chloride: 102 mmol/L (ref 101–111)
Creatinine, Ser: 0.68 mg/dL (ref 0.44–1.00)
Glucose, Bld: 117 mg/dL — ABNORMAL HIGH (ref 65–99)
POTASSIUM: 3.9 mmol/L (ref 3.5–5.1)
SODIUM: 135 mmol/L (ref 135–145)

## 2017-01-21 LAB — CBC
HCT: 35.2 % — ABNORMAL LOW (ref 36.0–46.0)
Hemoglobin: 11.4 g/dL — ABNORMAL LOW (ref 12.0–15.0)
MCH: 26.7 pg (ref 26.0–34.0)
MCHC: 32.4 g/dL (ref 30.0–36.0)
MCV: 82.4 fL (ref 78.0–100.0)
PLATELETS: 330 10*3/uL (ref 150–400)
RBC: 4.27 MIL/uL (ref 3.87–5.11)
RDW: 13.4 % (ref 11.5–15.5)
WBC: 4.7 10*3/uL (ref 4.0–10.5)

## 2017-01-21 LAB — I-STAT CG4 LACTIC ACID, ED: LACTIC ACID, VENOUS: 1.32 mmol/L (ref 0.5–1.9)

## 2017-01-21 NOTE — ED Notes (Signed)
Pt. advised nurse first that she going out to her car .

## 2017-01-21 NOTE — ED Notes (Signed)
Ortho at bedside at this time.

## 2017-01-21 NOTE — ED Notes (Signed)
Notified Ortho of need for short leg splint.
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
## 2017-01-21 NOTE — ED Notes (Signed)
Paged Ortho x 2. Waiting for response. Patient needs short leg splint prior to discharge.

## 2017-01-21 NOTE — Progress Notes (Signed)
Orthopedic Tech Progress Note Patient Details:  Caitlyn Ferguson 1991/05/04 [id number]  Ortho Devices Type of Ortho Device: Short leg splint Ortho Device/Splint Location: applied short leg splint to pt right leg/ankle.  pt tolerated well.  right foot/ankle.  Ortho Device/Splint Interventions: Application   Alvina ChouWilliams, Randee Upchurch C 01/21/2017, 7:10 AM

## 2017-01-21 NOTE — ED Provider Notes (Signed)
[facility]-EMERGENCY DEPT Provider Note   CSN: [id number] Arrival date & time: 01/21/17  0230     History   Chief Complaint Chief Complaint  Patient presents with  . Post-op Problem    HPI Caitlyn Ferguson is a 26 y.o. female.  HPI Patient underwent repair of an open right talus fracture with Dr. Renaye Rakers on 11/14/2016 and presents back to the emergency department complaining of ongoing pain in her right foot.  She's been lost to follow-up and has had no evaluation by Dr. Eulah Pont since the operation.  She still has a K wire in her talonavicular joint.  She feels like she may have run a fever but is not sure.  She is concerned that she could start to have an infection in the right foot.  Her pain is mild to moderate in severity.  No other complaints   Past Medical History:  Diagnosis Date  . Asthma     Patient Active Problem List   Diagnosis Date Noted  . Trichimoniasis 11/19/2016  . History of heroin abuse 11/15/2016  . 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 Open right ankle fracture 11/15/2016  . Splenic laceration 11/13/2016    Past Surgical History:  Procedure Laterality Date  . I&D EXTREMITY Right 11/13/2016   Procedure: IRRIGATION AND DEBRIDEMENT FOOT;  Surgeon: Sheral Apley, MD;  Location: [facility] OR;  Service: Orthopedics;  Laterality: Right;  . ORIF CALCANEOUS FRACTURE Right 11/14/2016   Procedure: OPEN REDUCTION INTERNAL FIXATION (ORIF) TALUS;  Surgeon: Sheral Apley, MD;  Location: [facility] OR;  Service: Orthopedics;  Laterality: Right;    OB History    No data available       Home Medications    Prior to Admission medications   Medication Sig Start Date End Date Taking? Authorizing Provider  albuterol (PROVENTIL HFA;VENTOLIN HFA) 108 (90 Base) MCG/ACT inhaler Inhale 2 puffs into the lungs every 6 (six) hours as needed for wheezing or shortness of breath.    [provider]  aspirin EC 325 MG tablet Take 1 tablet (325 mg total) by mouth daily. For 30  days post op for DVT Prophylaxis.  Start after cleared to have dvt prophylaxis by Trauma team. 11/14/16   Albina Billet III, PA-C  docusate sodium (COLACE) 100 MG capsule Take 1 capsule (100 mg total) by mouth 2 (two) times daily. To prevent constipation while taking pain medication. 11/14/16   Martensen, Lucretia Kern III, PA-C  methocarbamol (ROBAXIN) 500 MG tablet Take 1 tablet (500 mg total) by mouth every 6 (six) hours as needed for muscle spasms. 11/14/16   Martensen, Lucretia Kern III, PA-C  ondansetron (ZOFRAN) 4 MG tablet Take 1 tablet (4 mg total) by mouth every 8 (eight) hours as needed for nausea or vomiting. 11/14/16   Albina Billet III, PA-C  oxyCODONE 10 MG TABS Take 1 tablet (10 mg total) by mouth every 4 (four) hours as needed for moderate pain or severe pain (10mg  for mild pain, 15mg  for moderate pain, 20mg  for severe pain). 11/19/16   Jerre Simon, PA    Family History No family history on file.  Social History Social History  Substance Use Topics  . Smoking status: Current Every Day Smoker    Packs/day: 0.50  . Smokeless tobacco: Never Used  . Alcohol use No     Allergies   Nickel; Pineapple; Augmentin [amoxicillin-pot clavulanate]; Other; and Penicillins   Review of Systems Review of Systems  All other systems reviewed  and are negative.    Physical Exam Updated Vital Signs BP 127/77   Pulse (!) 109   Temp 97.6 F (36.4 C) (Oral)   Resp 18   Ht 5\' 3"  (1.6 m)   Wt 110 lb (49.9 kg)   LMP 01/13/2017   SpO2 97%   BMI 19.49 kg/m   Physical Exam  Constitutional: She is oriented to person, place, and time. She appears well-developed and well-nourished.  HENT:  Head: Normocephalic.  Eyes: EOM are normal.  Neck: Normal range of motion.  Pulmonary/Chest: Effort normal.  Abdominal: She exhibits no distension.  Musculoskeletal:  K wire in the dorsum of the right foot with mild faint erythema without significant warmth.  No drainage.  Sutures  still in place in the right lateral foot and ankle near the Achilles.  No surrounding signs of infection.  Normal pulses right foot  Neurological: She is alert and oriented to person, place, and time.  Psychiatric: She has a normal mood and affect.  Nursing note and vitals reviewed.    ED Treatments / Results  Labs (all labs ordered are listed, but only abnormal results are displayed) Labs Reviewed  CBC - Abnormal; Notable for the following:       Result Value   Hemoglobin 11.4 (*)    HCT 35.2 (*)    All other components within normal limits  BASIC METABOLIC PANEL - Abnormal; Notable for the following:    Glucose, Bld 117 (*)    All other components within normal limits  I-STAT CG4 LACTIC ACID, ED  I-STAT CG4 LACTIC ACID, ED    EKG  EKG Interpretation None       Radiology Dg Ankle Complete Right  Result Date: 01/21/2017 CLINICAL DATA:  [age] y/o F; history of ankle surgery 2 months ago with 2 days of pain. EXAM: RIGHT ANKLE - COMPLETE 3+ VIEW COMPARISON:  11/13/2016 ankle CT. FINDINGS: No acute fracture or dislocation identified. Surgical screws fixate posterior talus fracture. Medial talar dome cortical irregularity compatible with osteochondral injury. Pin fixation of comminuted anterior calcaneus fracture. There are small bony fragments inferior to the bilateral malleoli from prior fracture. Symmetric ankle mortise. IMPRESSION: 1. No acute fracture or dislocation identified. 2. Screw fixation of healing posterior talus fracture and pin fixation of comminuted anterior calcaneus fracture. Electronically Signed   By: Mitzi HansenLance  Furusawa-Stratton M.D.   On: 01/21/2017 06:08    Procedures Procedures (including critical care time)  Medications Ordered in ED Medications - No data to display   Initial Impression / Assessment and Plan / ED Course  I have reviewed the triage vital signs and the nursing notes.  Pertinent labs & imaging results that were available during my care of the  patient were reviewed by me and considered in my medical decision making (see chart for details).     Patient will need follow-up with Dr. Eulah PontMurphy this morning.  I discussed the case with him and he is planning to see her in the office today @ 830am (2 hours from now).  Placed back in a posterior splint until follow up. nonweight bearing status  Final Clinical Impressions(s) / ED Diagnoses   Final diagnoses:  None    New Prescriptions New Prescriptions   No medications on file     Azalia Bilisampos, Harnoor Reta, MD 01/21/17 [phone]

## 2017-03-15 ENCOUNTER — Inpatient Hospital Stay (HOSPITAL_COMMUNITY)
Admit: 2017-03-15 | Discharge: 2017-03-15 | Disposition: A | Discharge status: Home / Self Care | Payer: Self-pay | Attending: Internal Medicine | Admitting: Internal Medicine

## 2017-03-15 ENCOUNTER — Emergency Department: Payer: Self-pay

## 2017-03-15 ENCOUNTER — Inpatient Hospital Stay: Payer: Self-pay

## 2017-03-15 ENCOUNTER — Encounter: Payer: Self-pay | Admitting: Emergency Medicine

## 2017-03-15 ENCOUNTER — Inpatient Hospital Stay
Admission: EM | Admit: 2017-03-15 | Discharge: 2017-03-17 | DRG: 541 | Disposition: A | Discharge status: Home / Self Care | Payer: Self-pay | Attending: Internal Medicine | Admitting: Internal Medicine

## 2017-03-15 DIAGNOSIS — F17210 Nicotine dependence, cigarettes, uncomplicated: Secondary | ICD-10-CM | POA: Diagnosis present

## 2017-03-15 DIAGNOSIS — R509 Fever, unspecified: Secondary | ICD-10-CM

## 2017-03-15 DIAGNOSIS — M869 Osteomyelitis, unspecified: Principal | ICD-10-CM | POA: Diagnosis present

## 2017-03-15 DIAGNOSIS — Z9119 Patient's noncompliance with other medical treatment and regimen: Secondary | ICD-10-CM

## 2017-03-15 DIAGNOSIS — Z8249 Family history of ischemic heart disease and other diseases of the circulatory system: Secondary | ICD-10-CM

## 2017-03-15 DIAGNOSIS — J45909 Unspecified asthma, uncomplicated: Secondary | ICD-10-CM | POA: Diagnosis present

## 2017-03-15 DIAGNOSIS — M1990 Unspecified osteoarthritis, unspecified site: Secondary | ICD-10-CM | POA: Diagnosis present

## 2017-03-15 DIAGNOSIS — Z880 Allergy status to penicillin: Secondary | ICD-10-CM

## 2017-03-15 HISTORY — DX: Opioid abuse, in remission: F11.11

## 2017-03-15 LAB — COMPREHENSIVE METABOLIC PANEL
ALBUMIN: 3.8 g/dL (ref 3.5–5.0)
ALK PHOS: 95 U/L (ref 38–126)
ALT: 10 U/L — AB (ref 14–54)
ANION GAP: 6 (ref 5–15)
AST: 20 U/L (ref 15–41)
BUN: 17 mg/dL (ref 6–20)
CALCIUM: 9.4 mg/dL (ref 8.9–10.3)
CO2: 27 mmol/L (ref 22–32)
Chloride: 103 mmol/L (ref 101–111)
Creatinine, Ser: 0.75 mg/dL (ref 0.44–1.00)
GFR calc Af Amer: >60 mL/min (ref >60)
GFR calc non Af Amer: >60 mL/min (ref >60)
GLUCOSE: 112 mg/dL — AB (ref 65–99)
Potassium: 3.3 mmol/L — ABNORMAL LOW (ref 3.5–5.1)
SODIUM: 136 mmol/L (ref 135–145)
Total Bilirubin: 0.6 mg/dL (ref 0.3–1.2)
Total Protein: 9.4 g/dL — ABNORMAL HIGH (ref 6.5–8.1)

## 2017-03-15 LAB — CBC
HCT: 35.4 % (ref 35.0–47.0)
HEMOGLOBIN: 11.8 g/dL — AB (ref 12.0–16.0)
MCH: 26.1 pg (ref 26.0–34.0)
MCHC: 33.4 g/dL (ref 32.0–36.0)
MCV: 78.1 fL — ABNORMAL LOW (ref 80.0–100.0)
Platelets: 502 10*3/uL — ABNORMAL HIGH (ref 150–440)
RBC: 4.53 MIL/uL (ref 3.80–5.20)
RDW: 16.6 % — ABNORMAL HIGH (ref 11.5–14.5)
WBC: 5.5 10*3/uL (ref 3.6–11.0)

## 2017-03-15 LAB — ECHOCARDIOGRAM COMPLETE: Weight: 1760 oz

## 2017-03-15 LAB — PREGNANCY, URINE: PREG TEST UR: NEGATIVE

## 2017-03-15 LAB — URINE DRUG SCREEN, QUALITATIVE (ARMC ONLY)
Amphetamines, Ur Screen: POSITIVE — AB
Barbiturates, Ur Screen: NOT DETECTED
Benzodiazepine, Ur Scrn: POSITIVE — AB
Cannabinoid 50 Ng, Ur ~~LOC~~: POSITIVE — AB
Cocaine Metabolite,Ur ~~LOC~~: NOT DETECTED
MDMA (Ecstasy)Ur Screen: NOT DETECTED
Methadone Scn, Ur: NOT DETECTED
Opiate, Ur Screen: POSITIVE — AB
Phencyclidine (PCP) Ur S: NOT DETECTED
Tricyclic, Ur Screen: NOT DETECTED

## 2017-03-15 LAB — SEDIMENTATION RATE: SED RATE: 67 mm/h — AB (ref 0–20)

## 2017-03-15 LAB — TSH: TSH: 0.976 u[IU]/mL (ref 0.350–4.500)

## 2017-03-15 MED ORDER — VANCOMYCIN HCL IN DEXTROSE 1-5 GM/200ML-% IV SOLN
1000.0000 mg | Freq: Once | INTRAVENOUS | Status: AC
  Administered 2017-03-15: 1000 mg via INTRAVENOUS
  Filled 2017-03-15: qty 200

## 2017-03-15 MED ORDER — POTASSIUM CHLORIDE CRYS ER 20 MEQ PO TBCR
40.0000 meq | EXTENDED_RELEASE_TABLET | ORAL | Status: AC
  Administered 2017-03-15: 40 meq via ORAL
  Filled 2017-03-15: qty 2

## 2017-03-15 MED ORDER — METHOCARBAMOL 500 MG PO TABS
500.0000 mg | ORAL_TABLET | Freq: Four times a day (QID) | ORAL | Status: DC | PRN
  Administered 2017-03-16 – 2017-03-17 (×4): 500 mg via ORAL
  Filled 2017-03-15 (×4): qty 1

## 2017-03-15 MED ORDER — ONDANSETRON HCL 4 MG/2ML IJ SOLN
4.0000 mg | Freq: Once | INTRAMUSCULAR | Status: AC
  Administered 2017-03-15: 4 mg via INTRAVENOUS
  Filled 2017-03-15: qty 2

## 2017-03-15 MED ORDER — MORPHINE SULFATE (PF) 2 MG/ML IV SOLN
2.0000 mg | Freq: Once | INTRAVENOUS | Status: AC
  Administered 2017-03-15: 2 mg via INTRAVENOUS
  Filled 2017-03-15: qty 1

## 2017-03-15 MED ORDER — ALBUTEROL SULFATE (2.5 MG/3ML) 0.083% IN NEBU: 2.5000 mg | INHALATION_SOLUTION | RESPIRATORY_TRACT | Status: DC | PRN

## 2017-03-15 MED ORDER — SODIUM CHLORIDE 0.9 % IV SOLN
1.0000 g | Freq: Three times a day (TID) | INTRAVENOUS | Status: DC
  Administered 2017-03-15 – 2017-03-17 (×6): 1 g via INTRAVENOUS
  Filled 2017-03-15 (×8): qty 1

## 2017-03-15 MED ORDER — VANCOMYCIN HCL IN DEXTROSE 750-5 MG/150ML-% IV SOLN
750.0000 mg | Freq: Two times a day (BID) | INTRAVENOUS | Status: DC
  Administered 2017-03-15 – 2017-03-16 (×3): 750 mg via INTRAVENOUS
  Filled 2017-03-15 (×5): qty 150

## 2017-03-15 MED ORDER — OXYCODONE-ACETAMINOPHEN 5-325 MG PO TABS
1.0000 | ORAL_TABLET | Freq: Four times a day (QID) | ORAL | Status: DC | PRN
  Administered 2017-03-15: 1 via ORAL
  Administered 2017-03-15 – 2017-03-17 (×6): 2 via ORAL
  Filled 2017-03-15 (×3): qty 2
  Filled 2017-03-15: qty 1
  Filled 2017-03-15 (×3): qty 2

## 2017-03-15 MED ORDER — DOCUSATE SODIUM 100 MG PO CAPS
100.0000 mg | ORAL_CAPSULE | Freq: Two times a day (BID) | ORAL | Status: DC
  Administered 2017-03-15 – 2017-03-17 (×2): 100 mg via ORAL
  Filled 2017-03-15 (×4): qty 1

## 2017-03-15 MED ORDER — GADOBENATE DIMEGLUMINE 529 MG/ML IV SOLN
10.0000 mL | Freq: Once | INTRAVENOUS | Status: AC | PRN
  Administered 2017-03-15: 10 mL via INTRAVENOUS

## 2017-03-15 MED ORDER — ONDANSETRON HCL 4 MG/2ML IJ SOLN: 4.0000 mg | Freq: Four times a day (QID) | INTRAMUSCULAR | Status: DC | PRN

## 2017-03-15 MED ORDER — ONDANSETRON HCL 4 MG PO TABS: 4.0000 mg | ORAL_TABLET | Freq: Four times a day (QID) | ORAL | Status: DC | PRN

## 2017-03-15 MED ORDER — ASPIRIN EC 325 MG PO TBEC: 325.0000 mg | DELAYED_RELEASE_TABLET | Freq: Every day | ORAL | Status: DC

## 2017-03-15 NOTE — Progress Notes (Signed)
*  PRELIMINARY RESULTS* Echocardiogram 2D Echocardiogram has been performed.  Caitlyn Ferguson 03/15/2017, 1:26 PM 

## 2017-03-15 NOTE — ED Notes (Signed)
Pt again denies ability to urinate.

## 2017-03-15 NOTE — ED Notes (Signed)
MD Manson PasseyBrown and this Rn at bedside at this time

## 2017-03-15 NOTE — Progress Notes (Signed)
Subjective: Patient admitted early this morning with osteomyelitis. Currently has no complaints.  Objective: Vital signs in last 24 hours: Temp:  [98 F (36.7 C)] 98 F (36.7 C) (07/03 0808) Pulse Rate:  [76-110] 84 (07/03 0808) Resp:  [16-18] 18 (07/03 0808) BP: (103-119)/(66-84) 115/66 (07/03 0808) SpO2:  [99 %-100 %] 100 % (07/03 0808) Weight:  [49.9 kg (110 lb)] 49.9 kg (110 lb) (07/03 0359) Weight change:     Intake/Output from previous day: No intake/output data recorded. Intake/Output this shift: No intake/output data recorded.  General appearance: no distress  Lab Results:  Recent Labs  03/15/17 0444  WBC 5.5  HGB 11.8*  HCT 35.4  PLT 502*   BMET  Recent Labs  03/15/17 0444  NA 136  K 3.3*  CL 103  CO2 27  GLUCOSE 112*  BUN 17  CREATININE 0.75  CALCIUM 9.4    Studies/Results: Dg Foot Complete Right  Result Date: 03/15/2017 CLINICAL DATA:  26 y/o F; cellulitis with pertinent drainage from pole ORIF vertical site. Concern for osteomyelitis. EXAM: RIGHT FOOT COMPLETE - 3+ VIEW COMPARISON:  None. FINDINGS: There are lucencies within the head of the talus at its medial margin which may represent erosive changes of osteomyelitis. Linear lucency traversing the head talus and navicular bone from prior pin fixation. Soft tissue swelling about the midfoot. No acute fracture identified. IMPRESSION: Lucencies within the head of the talus at its medial margin may represent osteomyelitis. Consider MRI or three-phase bone scan for confirmation. Electronically Signed   By: Mitzi HansenLance  Furusawa-Stratton M.D.   On: 03/15/2017 05:11    Medications: I have reviewed the patient's current medications.  Assessment/Plan: 1. Osteomyelitis: Right foot; currently on IV vancomycin and meropenem. Podiatry is been consulted for possible debridement. 2. History of narcotic abuse: Urine drug screen was positive for multiple substances. Has history of IV drug use but denies any recently.  Echocardiogram ordered to assess for vegetation.  3. Asthma: Controlled; continue albuterol as needed  LOS: 0 days   Gracelyn NurseJohnston,  Toneisha Savary D 03/15/2017, 2:42 PM

## 2017-03-15 NOTE — Progress Notes (Signed)
Patient resting in bed. Patient removed all of her jewelry and placed in her personal bag. Pain meds given earlier with some relief. Visitor at bedside earlier mentioned that patient will need something like dilaudid or morphine, and that percocet wont touch anything. No c/o distress. Continue to monitor.

## 2017-03-15 NOTE — ED Notes (Signed)
Admitting MD at bedside.
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
## 2017-03-15 NOTE — Progress Notes (Signed)
Pharmacy Antibiotic Note  Caitlyn Ferguson is a 26 y.o. female admitted on 03/15/2017 with  Osteomyelitis.  Pharmacy has been consulted for Vancomycin and Meropenem dosing. Patient received Vancomycin 1gm IV x 1 dose in ED. Patient also has allergy (Rash, Swelling) to PCN.   Plan: Ke: 0.075   T1/2: 9.24   Vd: 35   Will start patient on Vancomycin 750mg  IV every 12 hours with 8 hour stack dosing. Calculated trough at Css is 16.9. Trough ordered prior to 4th dose. Will monitor renal function and adjust dose as needed.   Will start patient on meropenem 1g IV every 8 hours.   Weight: 110 lb (49.9 kg)  Temp (24hrs), Avg:98 F (36.7 C), Min:98 F (36.7 C), Max:98 F (36.7 C)   Recent Labs Lab 03/15/17 0444  WBC 5.5  CREATININE 0.75    Estimated Creatinine Clearance: 84.7 mL/min (by C-G formula based on SCr of 0.75 mg/dL).    Allergies  Allergen Reactions  . Nickel Rash  . Pineapple Nausea And Vomiting  . Augmentin [Amoxicillin-Pot Clavulanate] Swelling and Rash    Throat swelling  . Other Swelling and Rash    Reaction to cut grass  . Penicillins Swelling and Rash    Throat swelling Has patient had a PCN reaction causing immediate rash, facial/tongue/throat swelling, SOB or lightheadedness with hypotension: Yes Has patient had a PCN reaction causing severe rash involving mucus membranes or skin necrosis: No Has patient had a PCN reaction that required hospitalization No Has patient had a PCN reaction occurring within the last 10 years: No If all of the above answers are "NO", then may proceed with Cephalosporin use.    Antimicrobials this admission: 7/3 vancomycin >>  7/3 meropenem  >>   Dose adjustments this admission:   Microbiology results: 7/3 BCx: pending  Thank you for allowing pharmacy to be a part of this patient's care.  Gardner CandleSheema M Azim Gillingham, PharmD, BCPS Clinical Pharmacist 03/15/2017 8:21 AM
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
## 2017-03-15 NOTE — ED Notes (Addendum)
11 stitches removed from pt rt foot by MD Manson PasseyBrown. This RN and MD Manson PasseyBrown in with update

## 2017-03-15 NOTE — ED Notes (Signed)
Urine specimen again requested from pt at this time. Pt lying in bed comfortably with VSS in NAD with SO at bedside. Denies current concerns

## 2017-03-16 LAB — HEMOGLOBIN A1C
Hgb A1c MFr Bld: 5.4 % (ref 4.8–5.6)
Mean Plasma Glucose: 108 mg/dL

## 2017-03-16 MED ORDER — NICOTINE 21 MG/24HR TD PT24
21.0000 mg | MEDICATED_PATCH | Freq: Every day | TRANSDERMAL | Status: DC
  Administered 2017-03-16 – 2017-03-17 (×2): 21 mg via TRANSDERMAL
  Filled 2017-03-16 (×2): qty 1
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
## 2017-03-16 NOTE — Plan of Care (Signed)
Dr. Text to inform, per patient,  that "current pain meds are not working." Requested possible order for another option.

## 2017-03-16 NOTE — Discharge Planning (Addendum)
Dr. Informed patient in rounds POC was to get on PO anbx and have consult with ID tomorrow.  Then, he left to obtain CAM boot.  Dr. Albertha Gheeeturned to patient room with CAM boot from his office and noticed that patient may have left the hospital.  All belonging had been removed except a personal green blanket on the bed.  RN went out to front and did not see patient smoking ect... Unfortunately, patient still has IV intact.  Doctors (x2) informed of patient leaving hospital AMA.  1027 Patient has now returned to room and placed CAM boot on foot.  Patient stated that she left to smoke.  Doctors x2 informed

## 2017-03-16 NOTE — Plan of Care (Signed)
RN attempted to order CAM boot, per Dr. Phoebe SharpsNote.  Supplies indicated that the boot will need to come from Dr. Isidore Moosffice. If patient still needs CAM Boot, please note - Patient wears size 7.
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
## 2017-03-17 LAB — VANCOMYCIN, TROUGH: Vancomycin Tr: 13 ug/mL — ABNORMAL LOW (ref 15–20)

## 2017-03-17 MED ORDER — VANCOMYCIN HCL IN DEXTROSE 1-5 GM/200ML-% IV SOLN
1000.0000 mg | Freq: Two times a day (BID) | INTRAVENOUS | Status: DC
  Administered 2017-03-17: 1000 mg via INTRAVENOUS
  Filled 2017-03-17 (×3): qty 200

## 2017-03-17 MED ORDER — DOXYCYCLINE HYCLATE 50 MG PO CAPS: 100.0000 mg | ORAL_CAPSULE | Freq: Two times a day (BID) | ORAL | 0 refills | Status: AC

## 2017-03-17 NOTE — Discharge Summary (Signed)
Physician Discharge Summary  Patient ID: Caitlyn Ferguson MRN: [id number] DOB/AGE: [birth year] 25 y.o.  Admit date: 03/15/2017 Discharge date: 03/17/2017  Admission Diagnoses:1. Osteoarthritis  Discharge Diagnoses:  Active Problems:   Osteomyelitis [facility](HCC)   Discharged Condition: stable  Hospital Course:1. Osteomyelitis: Right foot; patient was seen by podiatry. There was a question about the MRI being consistent with osteomyelitis. Recommendation was for antibiotics and serial x-rays that will be followed up by her surgeon who did this originally. We originally put in for an infectious disease consult. However patient had contacted her surgeon in [place]. The original surgery and arrange an appointment for today before ID can see her. I discussed this with the patient. I wrote her prescription for doxycycline and she said she's going for an appointment at 3:15 with Dr. Eulah PontMurphy in [place].   Consults: Podiatry   Discharge Exam: Blood pressure 129/65, pulse 88, temperature 98.5 F (36.9 C), temperature source Oral, resp. rate 18, weight 57.4 kg (126 lb 9.6 oz), SpO2 98 %. General appearance: no distress Extremities: Right foot mildly edematous. Tenderness in the medial aspect.  Disposition: 01-Home or Self Care  Discharge Instructions    Diet - low sodium heart healthy    Complete by:  As directed    Increase activity slowly    Complete by:  As directed        discharge medication;  doxycycline 100 mg twice daily  Signed: Gracelyn NurseJohnston,  Cathan Gearin D 03/17/2017, 1:06 PM

## 2017-03-17 NOTE — Progress Notes (Signed)
Pharmacy Antibiotic Note  Caitlyn Ferguson is a 26 y.o. female admitted on 03/15/2017 with  Osteomyelitis.  Pharmacy has been consulted for Vancomycin and Meropenem dosing. Patient received Vancomycin 1gm IV x 1 dose in ED. Patient also has allergy (Rash, Swelling) to PCN.   Plan: Ke: 0.075   T1/2: 9.24   Vd: 35   Will start patient on Vancomycin 750mg  IV every 12 hours with 8 hour stack dosing. Calculated trough at Css is 16.9. Trough ordered prior to 4th dose. Will monitor renal function and adjust dose as needed.   7/5 @ 0024 VT 13 subtherapeutic. Goal trough 15 - 20 mcg/mL. Will increase dose to 1g q12h for an estimated Css 17.3 mcg/mL. Will check vanc trough 7/6 @ 1245 prior to 4th dose.  Will start patient on meropenem 1g IV every 8 hours.   Weight: 110 lb 3.2 oz (50 kg)  Temp (24hrs), Avg:98.7 F (37.1 C), Min:98.5 F (36.9 C), Max:98.9 F (37.2 C)   Recent Labs Lab 03/15/17 0444 03/17/17 0024  WBC 5.5  --   CREATININE 0.75  --   VANCOTROUGH  --  13*    Estimated Creatinine Clearance: 84.9 mL/min (by C-G formula based on SCr of 0.75 mg/dL).    Allergies  Allergen Reactions  . Nickel Rash  . Pineapple Nausea And Vomiting  . Augmentin [Amoxicillin-Pot Clavulanate] Swelling and Rash    Throat swelling  . Other Swelling and Rash    Reaction to cut grass  . Penicillins Swelling and Rash    Throat swelling Has patient had a PCN reaction causing immediate rash, facial/tongue/throat swelling, SOB or lightheadedness with hypotension: Yes Has patient had a PCN reaction causing severe rash involving mucus membranes or skin necrosis: No Has patient had a PCN reaction that required hospitalization No Has patient had a PCN reaction occurring within the last 10 years: No If all of the above answers are "NO", then may proceed with Cephalosporin use.    Antimicrobials this admission: 7/3 vancomycin >>  7/3 meropenem  >>   Dose adjustments this admission:   Microbiology  results: 7/3 BCx: pending  Thank you for allowing pharmacy to be a part of this patient's care.  Thomasene Rippleavid  Ceazia Harb, PharmD, BCPS Clinical Pharmacist 03/17/2017 1:39 AM

## 2017-03-17 NOTE — Care Management Note (Signed)
Case Management Note  Patient Details  Name: Caitlyn Ferguson MRN: [id number] Date of Birth: 04/30/1991  Subjective/Objective:  Met with patient at bedside to discuss discharge planning. Uninsured. Application given for [facility] and [facility]. Email sent to both agencies with referral. Patient appreciative.     Action/Plan:   Expected Discharge Date:                  Expected Discharge Plan:  Home/Self Care  In-House Referral:     Discharge planning Services  CM Consult, [facility], Medication Assistance  Post Acute Care Choice:    Choice offered to:     DME Arranged:    DME Agency:     HH Arranged:    HH Agency:     Status of Service:  Completed, signed off  If discussed at [employer] of Stay Meetings, dates discussed:    Additional Comments:  Jolly Mango, RN 03/17/2017, 12:06 PM

## 2017-03-17 NOTE — Discharge Planning (Signed)
Patient IV removed.  Discharge papers given, explained and educated.  Given script for PO anbx and set up FU appt for 1515 today in [place].  RN assessment and VS show stability for DC to home. Once ready, will be wheeled to front and boyfriend transporting home via car.

## 2017-03-20 LAB — CULTURE, BLOOD (ROUTINE X 2)
CULTURE: NO GROWTH
CULTURE: NO GROWTH
SPECIAL REQUESTS: ADEQUATE
Special Requests: ADEQUATE

## 2017-05-04 NOTE — Addendum Note (Signed)
Addendum  created 05/04/17 1207 by Achille Rich, MD   Sign clinical note

## 2017-08-24 IMAGING — DX DG FOOT COMPLETE 3+V*R*
3 series · 3 of 3 positions shown · non-contrast
Comparison: None.

CLINICAL DATA: 25 y/o F; cellulitis with pertinent drainage from
pole ORIF vertical site. Concern for osteomyelitis.

EXAM:
RIGHT FOOT COMPLETE - 3+ VIEW

[foot ap]
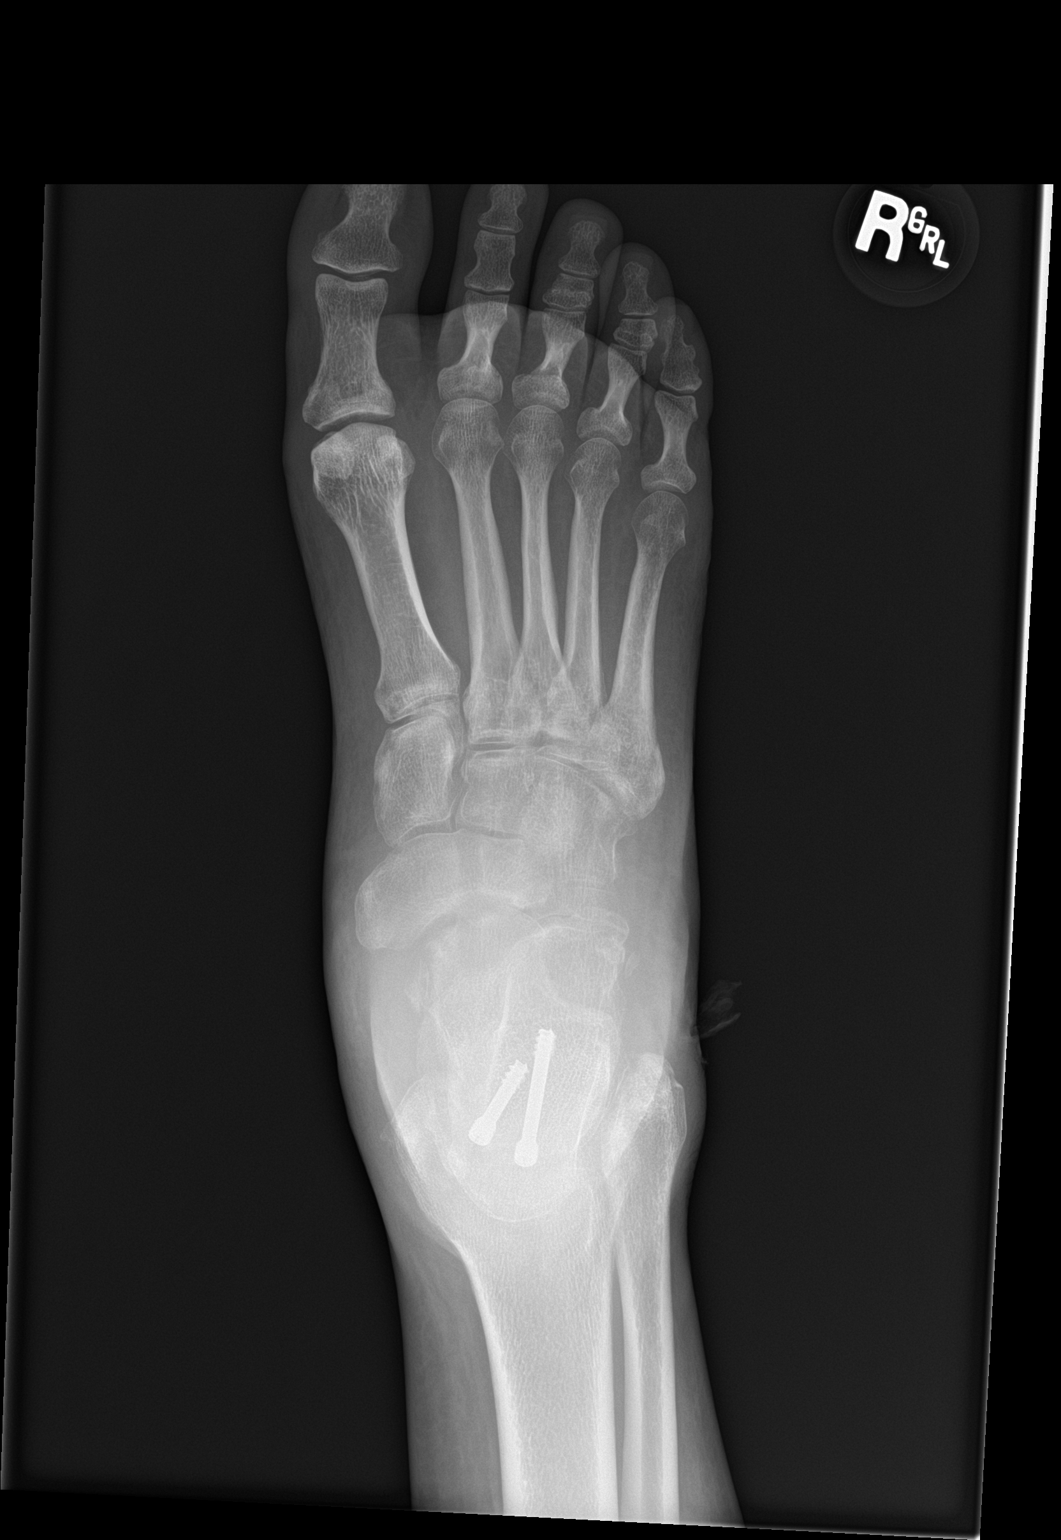

[foot obl]
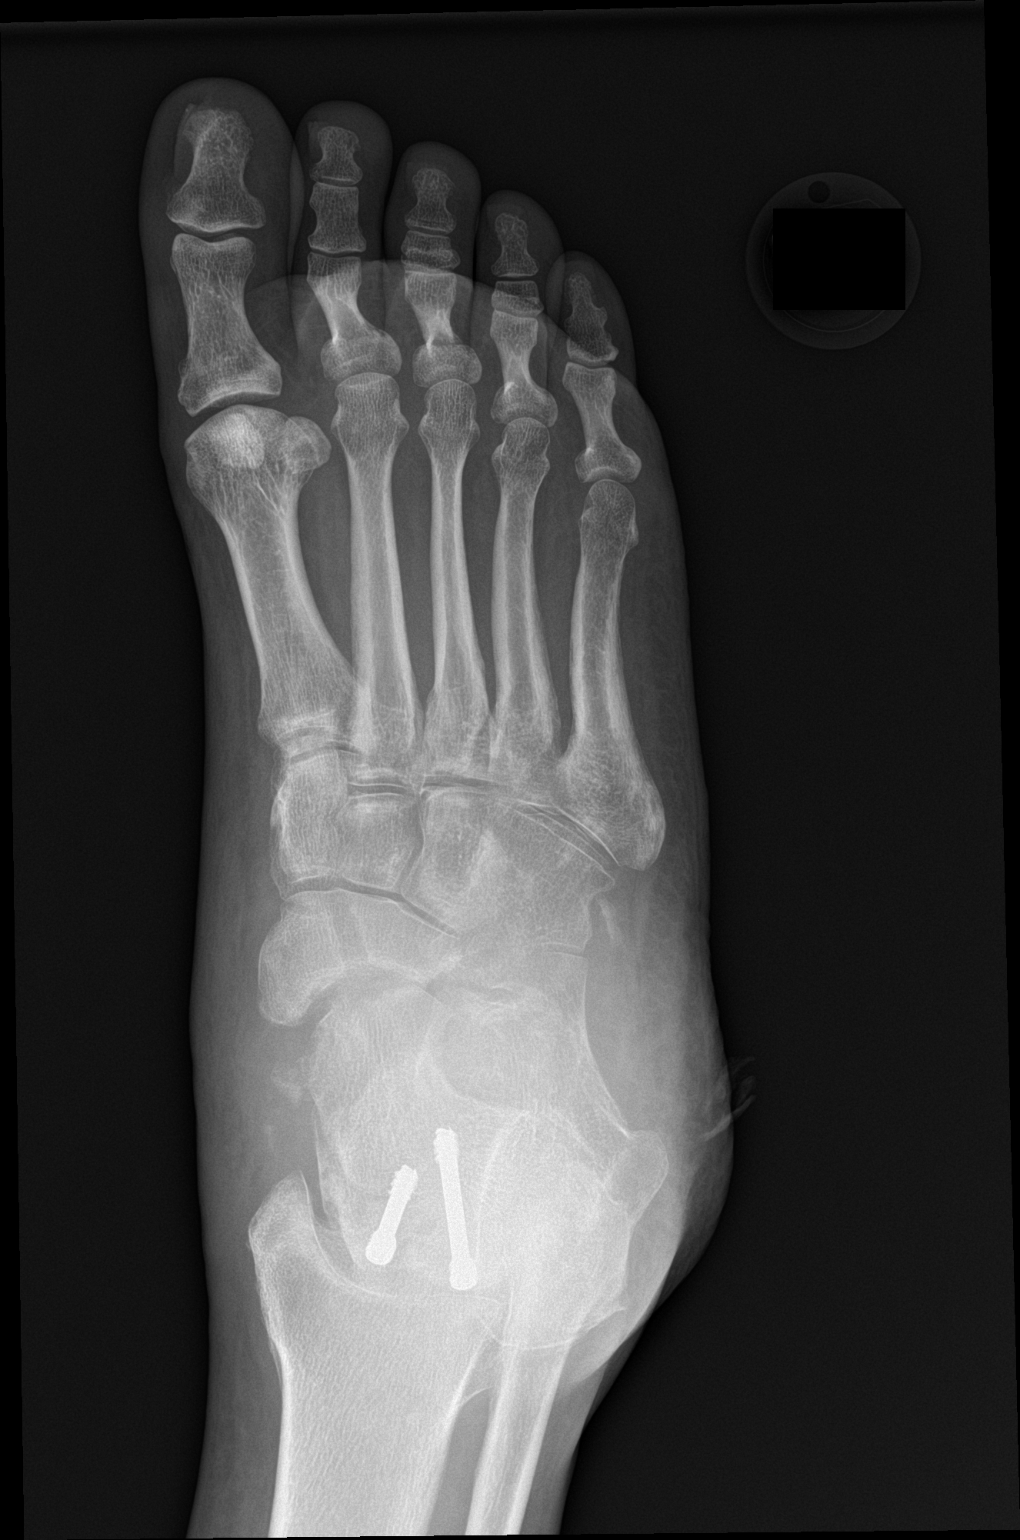

[foot lat]
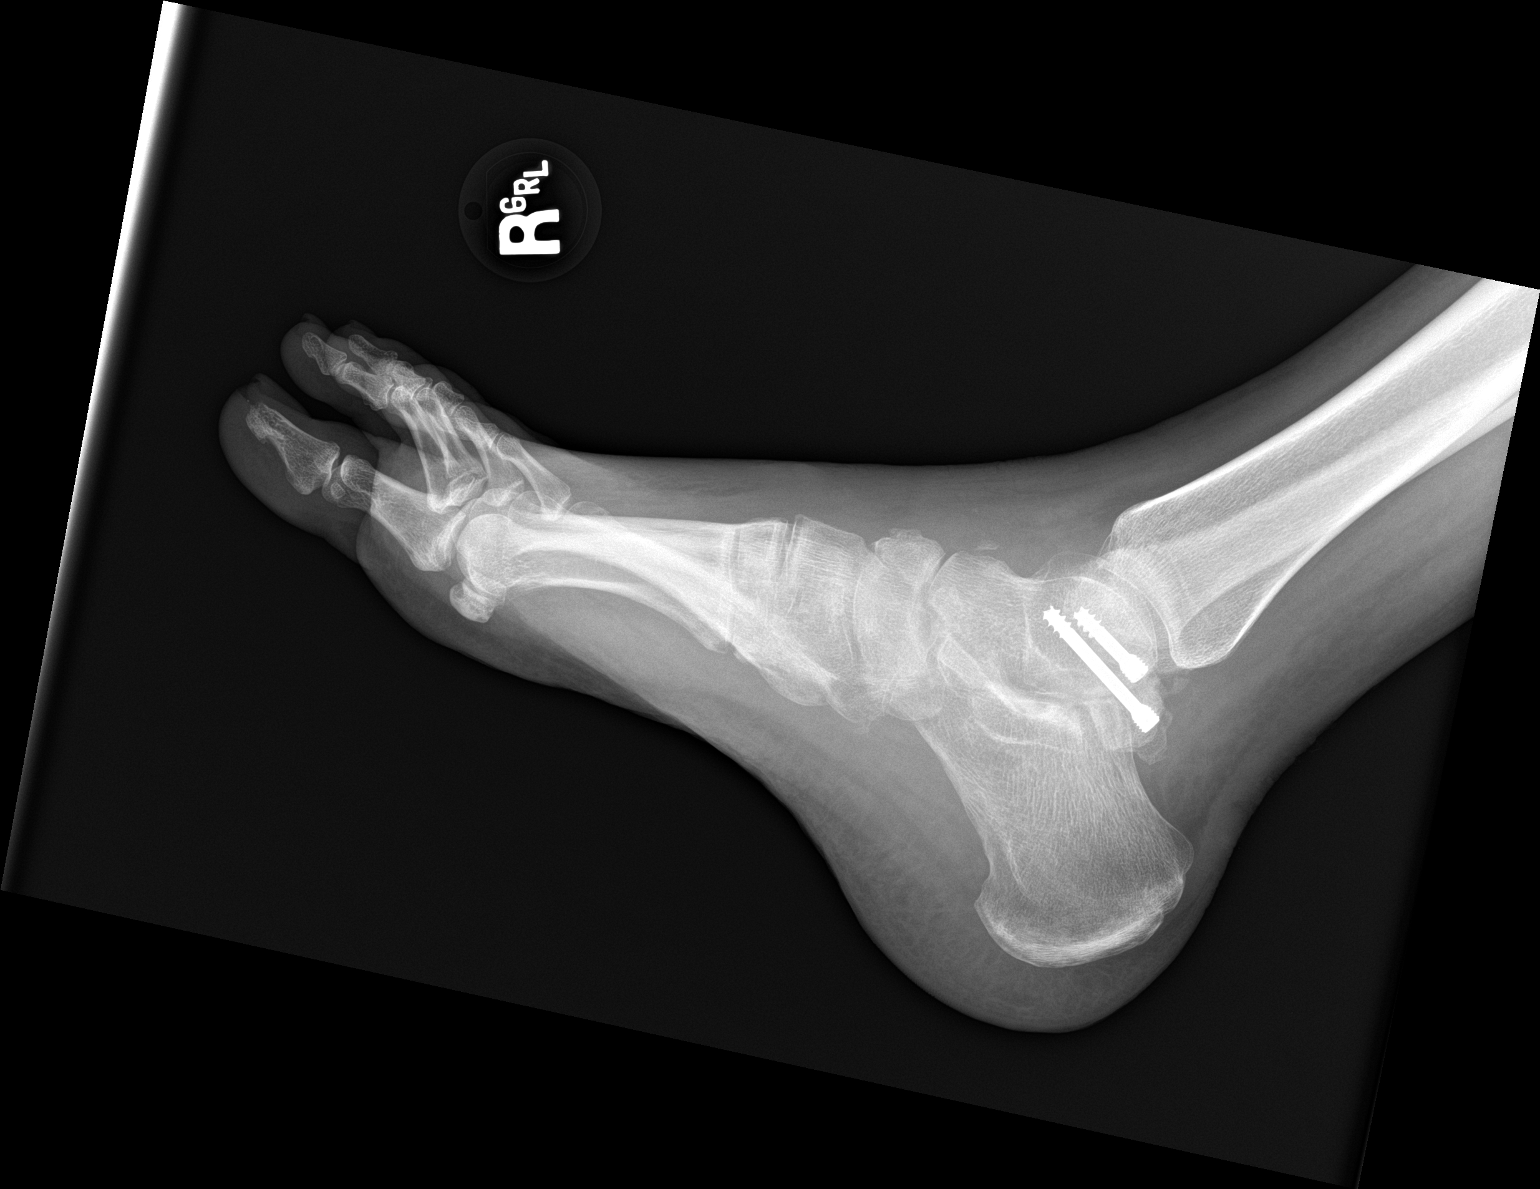

[3 of 3 positions shown; findings below may reference images not displayed]

FINDINGS: There are lucencies within the head of the talus at its medial
margin which may represent erosive changes of osteomyelitis. Linear
lucency traversing the head talus and navicular bone from prior pin
fixation. Soft tissue swelling about the midfoot. No acute fracture
identified.
IMPRESSION: Lucencies within the head of the talus at its medial margin may
represent osteomyelitis. Consider MRI or three-phase bone scan for
confirmation.

By: Lesya Aujla M.D.

## 2023-07-18 ENCOUNTER — Other Ambulatory Visit: Payer: Self-pay

## 2023-07-18 ENCOUNTER — Other Ambulatory Visit (HOSPITAL_BASED_OUTPATIENT_CLINIC_OR_DEPARTMENT_OTHER): Payer: Self-pay

## 2023-07-18 MED ORDER — TOPIRAMATE 25 MG PO TABS
50.0000 mg | ORAL_TABLET | Freq: Every morning | ORAL | 1 refills | Status: AC
  Filled 2023-07-18: qty 60, 30d supply, fill #0

## 2023-07-18 MED ORDER — WEGOVY 0.5 MG/0.5ML ~~LOC~~ SOAJ
0.5000 mg | SUBCUTANEOUS | 0 refills | Status: DC
  Filled 2023-07-18: qty 2, 28d supply, fill #0

## 2023-07-18 MED ORDER — LISDEXAMFETAMINE DIMESYLATE 50 MG PO CAPS
50.0000 mg | ORAL_CAPSULE | Freq: Every morning | ORAL | 0 refills | Status: AC
  Filled 2023-07-18: qty 30, 30d supply, fill #0

## 2023-07-19 ENCOUNTER — Other Ambulatory Visit (HOSPITAL_BASED_OUTPATIENT_CLINIC_OR_DEPARTMENT_OTHER): Payer: Self-pay

## 2023-07-21 ENCOUNTER — Other Ambulatory Visit (HOSPITAL_BASED_OUTPATIENT_CLINIC_OR_DEPARTMENT_OTHER): Payer: Self-pay

## 2023-07-28 ENCOUNTER — Other Ambulatory Visit (HOSPITAL_BASED_OUTPATIENT_CLINIC_OR_DEPARTMENT_OTHER): Payer: Self-pay
